# Patient Record
Sex: Male | Born: 1980 | Race: Black or African American | Hispanic: No | Marital: Married | Smoking: Current every day smoker
Health system: Southern US, Community
[De-identification: ages and names within clinical notes are randomized; demographics above are authoritative.]

## PROBLEM LIST (undated history)

## (undated) DIAGNOSIS — I10 Essential (primary) hypertension: Secondary | ICD-10-CM

## (undated) DIAGNOSIS — Z8701 Personal history of pneumonia (recurrent): Secondary | ICD-10-CM

## (undated) DIAGNOSIS — G4733 Obstructive sleep apnea (adult) (pediatric): Secondary | ICD-10-CM

## (undated) DIAGNOSIS — N183 Chronic kidney disease, stage 3 unspecified: Secondary | ICD-10-CM

## (undated) DIAGNOSIS — B370 Candidal stomatitis: Secondary | ICD-10-CM

## (undated) DIAGNOSIS — E559 Vitamin D deficiency, unspecified: Secondary | ICD-10-CM

## (undated) DIAGNOSIS — R7989 Other specified abnormal findings of blood chemistry: Secondary | ICD-10-CM

## (undated) DIAGNOSIS — R569 Unspecified convulsions: Secondary | ICD-10-CM

## (undated) DIAGNOSIS — N19 Unspecified kidney failure: Secondary | ICD-10-CM

## (undated) DIAGNOSIS — E785 Hyperlipidemia, unspecified: Secondary | ICD-10-CM

## (undated) DIAGNOSIS — G40209 Localization-related (focal) (partial) symptomatic epilepsy and epileptic syndromes with complex partial seizures, not intractable, without status epilepticus: Secondary | ICD-10-CM

## (undated) HISTORY — DX: Personal history of pneumonia (recurrent): Z87.01

## (undated) HISTORY — DX: Localization-related (focal) (partial) symptomatic epilepsy and epileptic syndromes with complex partial seizures, not intractable, without status epilepticus: G40.209

## (undated) HISTORY — DX: Chronic kidney disease, stage 3 unspecified: N18.30

## (undated) HISTORY — DX: Hyperlipidemia, unspecified: E78.5

## (undated) HISTORY — DX: Vitamin D deficiency, unspecified: E55.9

## (undated) HISTORY — DX: Other specified abnormal findings of blood chemistry: R79.89

## (undated) HISTORY — DX: Essential (primary) hypertension: I10

## (undated) HISTORY — DX: Obstructive sleep apnea (adult) (pediatric): G47.33

## (undated) HISTORY — PX: WRIST SURGERY: SHX841

## (undated) HISTORY — PX: KIDNEY TRANSPLANT: SHX239

---

## 2011-08-24 ENCOUNTER — Encounter: Payer: Self-pay | Admitting: Emergency Medicine

## 2011-08-24 ENCOUNTER — Emergency Department (HOSPITAL_BASED_OUTPATIENT_CLINIC_OR_DEPARTMENT_OTHER)
Admission: EM | Admit: 2011-08-24 | Discharge: 2011-08-24 | Disposition: A | Discharge status: Home / Self Care | Payer: Medicare Other | Attending: Emergency Medicine | Admitting: Emergency Medicine

## 2011-08-24 DIAGNOSIS — K047 Periapical abscess without sinus: Secondary | ICD-10-CM

## 2011-08-24 DIAGNOSIS — I10 Essential (primary) hypertension: Secondary | ICD-10-CM | POA: Insufficient documentation

## 2011-08-24 DIAGNOSIS — K089 Disorder of teeth and supporting structures, unspecified: Secondary | ICD-10-CM | POA: Insufficient documentation

## 2011-08-24 DIAGNOSIS — Z79899 Other long term (current) drug therapy: Secondary | ICD-10-CM | POA: Insufficient documentation

## 2011-08-24 HISTORY — DX: Essential (primary) hypertension: I10

## 2011-08-24 HISTORY — DX: Unspecified kidney failure: N19

## 2011-08-24 MED ORDER — HYDROCODONE-ACETAMINOPHEN 5-500 MG PO TABS: 1.0000 | ORAL_TABLET | Freq: Four times a day (QID) | ORAL | Status: AC | PRN

## 2011-08-24 MED ORDER — CLINDAMYCIN HCL 150 MG PO CAPS: 150.0000 mg | ORAL_CAPSULE | Freq: Four times a day (QID) | ORAL | Status: AC

## 2011-08-24 NOTE — ED Notes (Signed)
Pt has abscess at right rear tooth.  Pt states he has some tenderness at gum line.  Area swollen and red.

## 2011-08-24 NOTE — ED Provider Notes (Signed)
History     CSN: [id number]  Arrival date & time 08/24/11  1248   First MD Initiated Contact with Patient 08/24/11 1310      Chief Complaint  Patient presents with  . Dental Pain    (Consider location/radiation/quality/duration/timing/severity/associated sxs/prior treatment) Patient is a 31 y.o. male presenting with tooth pain. The history is provided by the patient. No language interpreter was used.  Dental PainThe primary symptoms include mouth pain. Primary symptoms do not include sore throat or angioedema. The symptoms began 5 to 7 days ago. The symptoms are worsening. The symptoms are new. The symptoms occur constantly.  Additional symptoms include: gum swelling and gum tenderness.    Past Medical History  Diagnosis Date  . Hypertension   . Kidney failure     Past Surgical History  Procedure Date  . Kidney transplant     History reviewed. No pertinent family history.  History  Substance Use Topics  . Smoking status: Not on file  . Smokeless tobacco: Not on file  . Alcohol Use:       Review of Systems  Constitutional: Negative.   HENT: Positive for dental problem. Negative for sore throat.   Respiratory: Negative.   Cardiovascular: Negative.     Allergies  Review of patient's allergies indicates no known allergies.  Home Medications   Current Outpatient Rx  Name Route Sig Dispense Refill  . AMLODIPINE BESYLATE 10 MG PO TABS Oral Take 10 mg by mouth daily.      Marland Kitchen CLONIDINE HCL 0.2 MG PO TABS Oral Take 0.2 mg by mouth 3 (three) times daily.      Marland Kitchen FERROUS SULFATE 325 (65 FE) MG PO TABS Oral Take 325 mg by mouth 2 (two) times daily.      Marland Kitchen LABETALOL HCL 100 MG PO TABS Oral Take 100 mg by mouth 4 (four) times daily - after meals and at bedtime.      Marland Kitchen LISINOPRIL 10 MG PO TABS Oral Take 10 mg by mouth daily.      Marland Kitchen MAGNESIUM 30 MG PO TABS Oral Take 30 mg by mouth 2 (two) times daily.      Marland Kitchen MYCOPHENOLATE MOFETIL 250 MG PO CAPS Oral Take 1,000 mg by mouth 2  (two) times daily.      Marland Kitchen PREDNISONE 1 MG PO TABS Oral Take 1 mg by mouth daily.      Marland Kitchen TACROLIMUS 0.5 MG PO CAPS Oral Take 0.5 mg by mouth 3 (three) times daily.      Marland Kitchen CLINDAMYCIN HCL 150 MG PO CAPS Oral Take 1 capsule (150 mg total) by mouth every 6 (six) hours. 28 capsule 0    BP 120/78  Pulse 92  Temp(Src) 98 F (36.7 C) (Oral)  Resp 18  Ht 5\' 6"  (1.676 m)  Wt 158 lb (71.668 kg)  BMI 25.50 kg/m2  SpO2 100%  Physical Exam  Nursing note and vitals reviewed. Constitutional: He is oriented to person, place, and time. He appears well-developed and well-nourished.  HENT:       Pt has swelling noted to the right lower gum:no fluctuance noted  Cardiovascular: Normal rate and regular rhythm.   Pulmonary/Chest: Effort normal and breath sounds normal.  Neurological: He is alert and oriented to person, place, and time.    ED Course  Procedures (including critical care time)  Labs Reviewed - No data to display No results found.   1. Dental abscess       MDM  Will  treat for abscess:pt is waiting for medicaid to start prior to going in to the dentist:not consistent with ludwig's angina       Teressa Lower, NP 08/24/11 1342  Hilario Quarry, MD 08/29/11 [phone]

## 2011-08-29 NOTE — ED Provider Notes (Signed)
History/physical exam/procedure(s) were performed by non-physician practitioner and as supervising physician I was immediately available for consultation/collaboration. I have reviewed all notes and am in agreement with care and plan.   Hilario Quarry, MD 08/29/11 [phone]

## 2011-10-09 ENCOUNTER — Encounter (HOSPITAL_COMMUNITY): Payer: Self-pay | Admitting: Emergency Medicine

## 2011-10-09 ENCOUNTER — Other Ambulatory Visit: Payer: Self-pay

## 2011-10-09 ENCOUNTER — Emergency Department (HOSPITAL_COMMUNITY): Payer: Medicare Other

## 2011-10-09 ENCOUNTER — Emergency Department (HOSPITAL_COMMUNITY)
Admission: EM | Admit: 2011-10-09 | Discharge: 2011-10-09 | Disposition: A | Discharge status: Home / Self Care | Payer: Medicare Other | Attending: Emergency Medicine | Admitting: Emergency Medicine

## 2011-10-09 DIAGNOSIS — Z940 Kidney transplant status: Secondary | ICD-10-CM | POA: Insufficient documentation

## 2011-10-09 DIAGNOSIS — M25519 Pain in unspecified shoulder: Secondary | ICD-10-CM | POA: Insufficient documentation

## 2011-10-09 DIAGNOSIS — M549 Dorsalgia, unspecified: Secondary | ICD-10-CM | POA: Insufficient documentation

## 2011-10-09 DIAGNOSIS — Z79899 Other long term (current) drug therapy: Secondary | ICD-10-CM | POA: Insufficient documentation

## 2011-10-09 DIAGNOSIS — S4980XA Other specified injuries of shoulder and upper arm, unspecified arm, initial encounter: Secondary | ICD-10-CM | POA: Insufficient documentation

## 2011-10-09 DIAGNOSIS — F17200 Nicotine dependence, unspecified, uncomplicated: Secondary | ICD-10-CM | POA: Insufficient documentation

## 2011-10-09 DIAGNOSIS — I10 Essential (primary) hypertension: Secondary | ICD-10-CM | POA: Insufficient documentation

## 2011-10-09 DIAGNOSIS — S46909A Unspecified injury of unspecified muscle, fascia and tendon at shoulder and upper arm level, unspecified arm, initial encounter: Secondary | ICD-10-CM | POA: Insufficient documentation

## 2011-10-09 DIAGNOSIS — R51 Headache: Secondary | ICD-10-CM | POA: Insufficient documentation

## 2011-10-09 DIAGNOSIS — R569 Unspecified convulsions: Secondary | ICD-10-CM | POA: Insufficient documentation

## 2011-10-09 DIAGNOSIS — S4991XA Unspecified injury of right shoulder and upper arm, initial encounter: Secondary | ICD-10-CM

## 2011-10-09 DIAGNOSIS — Y92009 Unspecified place in unspecified non-institutional (private) residence as the place of occurrence of the external cause: Secondary | ICD-10-CM | POA: Insufficient documentation

## 2011-10-09 DIAGNOSIS — R296 Repeated falls: Secondary | ICD-10-CM | POA: Insufficient documentation

## 2011-10-09 DIAGNOSIS — M4040 Postural lordosis, site unspecified: Secondary | ICD-10-CM | POA: Insufficient documentation

## 2011-10-09 DIAGNOSIS — M545 Low back pain, unspecified: Secondary | ICD-10-CM | POA: Insufficient documentation

## 2011-10-09 LAB — DIFFERENTIAL
Basophils Absolute: 0 10*3/uL (ref 0.0–0.1)
Eosinophils Relative: 1 % (ref 0–5)
Lymphs Abs: 0.6 10*3/uL — ABNORMAL LOW (ref 0.7–4.0)
Monocytes Absolute: 0.4 10*3/uL (ref 0.1–1.0)
Monocytes Relative: 4 % (ref 3–12)
Neutrophils Relative %: 89 % — ABNORMAL HIGH (ref 43–77)

## 2011-10-09 LAB — CBC
MCH: 23.3 pg — ABNORMAL LOW (ref 26.0–34.0)
Platelets: 149 10*3/uL — ABNORMAL LOW (ref 150–400)
RBC: 4.59 MIL/uL (ref 4.22–5.81)
RDW: 15.9 % — ABNORMAL HIGH (ref 11.5–15.5)
WBC: 10.1 10*3/uL (ref 4.0–10.5)

## 2011-10-09 LAB — BASIC METABOLIC PANEL
Calcium: 10.1 mg/dL (ref 8.4–10.5)
Creatinine, Ser: 1.76 mg/dL — ABNORMAL HIGH (ref 0.50–1.35)
GFR calc non Af Amer: 50 mL/min — ABNORMAL LOW (ref >90)
Sodium: 137 mEq/L (ref 135–145)

## 2011-10-09 LAB — RAPID URINE DRUG SCREEN, HOSP PERFORMED
Amphetamines: POSITIVE — AB
Opiates: NOT DETECTED

## 2011-10-09 LAB — ETHANOL: Alcohol, Ethyl (B): <11 mg/dL (ref 0–11)

## 2011-10-09 MED ORDER — OXYCODONE-ACETAMINOPHEN 5-325 MG PO TABS: 1.0000 | ORAL_TABLET | ORAL | Status: AC | PRN

## 2011-10-09 MED ORDER — ONDANSETRON HCL 4 MG/2ML IJ SOLN
4.0000 mg | Freq: Once | INTRAMUSCULAR | Status: AC
  Administered 2011-10-09: 4 mg via INTRAVENOUS
  Filled 2011-10-09: qty 2

## 2011-10-09 MED ORDER — OXYCODONE-ACETAMINOPHEN 5-325 MG PO TABS
1.0000 | ORAL_TABLET | Freq: Once | ORAL | Status: AC
  Administered 2011-10-09: 1 via ORAL
  Filled 2011-10-09: qty 1

## 2011-10-09 MED ORDER — HYDROMORPHONE HCL PF 1 MG/ML IJ SOLN
1.0000 mg | Freq: Once | INTRAMUSCULAR | Status: AC
  Administered 2011-10-09: 1 mg via INTRAVENOUS
  Filled 2011-10-09: qty 1

## 2011-10-09 MED ORDER — ONDANSETRON HCL 4 MG/2ML IJ SOLN
INTRAMUSCULAR | Status: AC
  Filled 2011-10-09: qty 2

## 2011-10-09 NOTE — ED Notes (Signed)
Pt found in shower floor by wife after hearing him fall, pt was seizing when wife found him, unsure if seizing prior to fall. No hx of seizure.

## 2011-10-09 NOTE — Discharge Instructions (Signed)
Do not drive until seen by a neurologist for a followup evaluation. Take all of your medications as directed. Where the right arm sling as needed for comfort. Followup with the on-call orthopedic doctor if the shoulder is not better in one week. Return here if needed for problems.  Seizure, Adult A seizure is when the body shakes uncontrollably (convulsion). It can be a scary experience. A seizure is not a diagnosis. It is a sign that something else may be wrong with brain and/or spinal cord (central nervous system). In the Emergency Department, your condition is evaluated. The seizure is then treated. You will likely need follow-up with your caregiver. You will possibly need further testing and evaluation. Your caregiver or the specialist to whom you are referred will determine if further treatment is needed. After a seizure, you may be confused, dazed and drowsy. These problems (symptoms) often follow a seizure. Medication given to treat the seizure may also cause some of these changes. The time following a seizure is known as a refractory period. Hospital admission is seldom required unless there are other conditions present such as trauma or metabolic problems. Sometimes the seizure activity follows a fainting episode. This may have been caused by a brief drop in blood pressure. These fainting (syncopal) seizures are generally not a cause for concern.  HOME CARE INSTRUCTIONS   Follow up with your caregiver as suggested.   If any problems happen, get help right away.   Do not swim or drive until your caregiver says it is okay.  Document Released: 08/01/2000 Document Revised: 04/16/2011 Document Reviewed: 12/15/2007 [facility] Patient Information 2012 [place], [place].

## 2011-10-09 NOTE — ED Provider Notes (Signed)
History     CSN: [id number]  Arrival date & time 10/09/11  1650   First MD Initiated Contact with Patient 10/09/11 1652      Chief Complaint  Patient presents with  . Fall    pt was in the shower, wife found him on the shower floor he was having seizure, unsure if seizure was prior to fall or afterwards. hx kidney transplant.    (Consider location/radiation/quality/duration/timing/severity/associated sxs/prior treatment) HPI Marcus Greene is a 31 y.o. male who was taking a shower when his wife heard him fall. She arrived to find him in an awkward position on the floor of the shower having a seizure. He shook for a few minutes, then stop shaking and was snoring. She called EMS. He remained stuporous until the transport was complete. This does not remember the incident. In emergency department. When I saw him he complained of tongue pain, where he bit it and diffuse back pain. He denies neck pain. He does have a headache.  Patient has no history of seizure. He does not drink alcohol, or take drugs. Patient is followed at [facility] for his kidney transplant. He states he has been taking his medications regularly.     Past Medical History  Diagnosis Date  . Hypertension   . Kidney failure     Past Surgical History  Procedure Date  . Kidney transplant     History reviewed. No pertinent family history.  History  Substance Use Topics  . Smoking status: Current Everyday Smoker -- 1.0 packs/day    Types: Cigarettes  . Smokeless tobacco: Not on file  . Alcohol Use: No      Review of Systems  All other systems reviewed and are negative.    Allergies  Review of patient's allergies indicates no known allergies.  Home Medications   Current Outpatient Rx  Name Route Sig Dispense Refill  . AMLODIPINE BESYLATE 10 MG PO TABS Oral Take 10 mg by mouth daily.      Marland Kitchen CLONIDINE HCL 0.2 MG PO TABS Oral Take 0.2 mg by mouth 3 (three) times daily.      Marland Kitchen FERROUS  SULFATE 325 (65 FE) MG PO TABS Oral Take 325 mg by mouth 2 (two) times daily.      Marland Kitchen HYDROCODONE-ACETAMINOPHEN 5-500 MG PO TABS Oral Take 1-2 tablets by mouth every 6 (six) hours as needed. For pain    . LABETALOL HCL 200 MG PO TABS Oral Take 600 mg by mouth 2 (two) times daily.    Marland Kitchen LISINOPRIL 10 MG PO TABS Oral Take 10 mg by mouth daily.      Marland Kitchen MAGNESIUM OXIDE 400 MG PO TABS Oral Take 400 mg by mouth daily.    Marland Kitchen MYCOPHENOLATE MOFETIL 250 MG PO CAPS Oral Take 1,000 mg by mouth 2 (two) times daily.      Marland Kitchen PREDNISOLONE 5 MG PO TABS Oral Take 5 mg by mouth daily.    Marland Kitchen TACROLIMUS 1 MG PO CAPS Oral Take 4 mg by mouth 2 (two) times daily.    . OXYCODONE-ACETAMINOPHEN 5-325 MG PO TABS Oral Take 1 tablet by mouth every 4 (four) hours as needed for pain. 20 tablet 0    BP 134/90  Pulse 85  Temp(Src) 98 F (36.7 C) (Oral)  Resp 18  SpO2 100%  Physical Exam  Nursing note and vitals reviewed. Constitutional: He is oriented to person, place, and time. He appears well-developed and well-nourished.  HENT:  Head: Normocephalic and  atraumatic.  Right Ear: External ear normal.  Left Ear: External ear normal.  Eyes: Conjunctivae and EOM are normal. Pupils are equal, round, and reactive to light.  Neck: Normal range of motion and phonation normal. Neck supple.  Cardiovascular: Normal rate, regular rhythm, normal heart sounds and intact distal pulses.   Pulmonary/Chest: Effort normal and breath sounds normal. He exhibits no bony tenderness.  Abdominal: Soft. Normal appearance. There is no tenderness.  Musculoskeletal: Normal range of motion.  Neurological: He is alert and oriented to person, place, and time. He has normal strength. No cranial nerve deficit or sensory deficit. He exhibits normal muscle tone. Coordination normal.  Skin: Skin is warm, dry and intact.  Psychiatric: He has a normal mood and affect. His behavior is normal. Judgment and thought content normal.    ED Course  Procedures  (including critical care time)   Date: 10/09/2011  Rate: 89  Rhythm: normal sinus rhythm  QRS Axis: normal  Intervals: normal  ST/T Wave abnormalities: nonspecific ST changes  Conduction Disutrbances:none  Narrative Interpretation:   Old EKG Reviewed: none available  Reevaluation: 21:00 Patient was treated with Dilaudid for back pain and required a second dose. No further seizures in emergency department. Repeat vital signs, normal. Ambulation trial ordered. At this time the patient complains of severe right shoulder pain, a x-ray is ordered. There is no appreciable deformity of the right or left shoulder. Left shoulder moves normally and is nontender to palpation.  23:15: He was treated with a right arm sling and Percocet. Repeat vital signs are reassuring.   Labs Reviewed  BASIC METABOLIC PANEL - Abnormal; Notable for the following:    Glucose, Bld 101 (*)    Creatinine, Ser 1.76 (*)    GFR calc non Af Amer 50 (*)    GFR calc Af Amer 58 (*)    All other components within normal limits  CBC - Abnormal; Notable for the following:    Hemoglobin 10.7 (*)    HCT 31.1 (*)    MCV 67.8 (*)    MCH 23.3 (*)    RDW 15.9 (*)    Platelets 149 (*)    All other components within normal limits  DIFFERENTIAL - Abnormal; Notable for the following:    Neutrophils Relative 89 (*)    Lymphocytes Relative 6 (*)    Neutro Abs 9.0 (*)    Lymphs Abs 0.6 (*)    All other components within normal limits  URINE RAPID DRUG SCREEN (HOSP PERFORMED) - Abnormal; Notable for the following:    Amphetamines POSITIVE (*)    Tetrahydrocannabinol POSITIVE (*)    All other components within normal limits  ETHANOL   Dg Thoracic Spine 2 View  10/09/2011  *RADIOLOGY REPORT*  Clinical Data: Fall, seizure.  THORACIC SPINE - 2 VIEW  Comparison: None.  Findings: No acute bony abnormality.  Specifically, no fracture or malalignment.  No significant degenerative disease.  IMPRESSION: No acute findings.  Original  Report Authenticated By: Cyndie Chime, M.D.   Dg Lumbar Spine Complete  10/09/2011  *RADIOLOGY REPORT*  Clinical Data: Fall, low back pain.  LUMBAR SPINE - COMPLETE 4+ VIEW  Comparison: None  Findings: There are five lumbar-type vertebral bodies.  No fracture or malalignment.  Disc spaces well maintained.  SI joints are symmetric.  IMPRESSION: No bony abnormality.  Original Report Authenticated By: Cyndie Chime, M.D.   Dg Shoulder Right  10/09/2011  *RADIOLOGY REPORT*  Clinical Data: Status post seizure and fall;  right shoulder pain.  RIGHT SHOULDER - 2+ VIEW  Comparison: None.  Findings: There is no evidence of fracture or dislocation.  The right humeral head is seated within the glenoid fossa.  The acromioclavicular joint is unremarkable in appearance.  No significant soft tissue abnormalities are seen.  The visualized portions of the right lung are clear.  IMPRESSION: No evidence of fracture or dislocation.  Original Report Authenticated By: Tonia Ghent, M.D.   Ct Head Wo Contrast  10/09/2011  *RADIOLOGY REPORT*  Clinical Data:  Larey Seat.  Seizure.  CT HEAD WITHOUT CONTRAST CT CERVICAL SPINE WITHOUT CONTRAST  Technique:  Multidetector CT imaging of the head and cervical spine was performed following the standard protocol without intravenous contrast.  Multiplanar CT image reconstructions of the cervical spine were also generated.  Comparison:   None  CT HEAD  Findings: Normal appearing cerebral hemispheres and posterior fossa structures.  Normal size and position of the ventricles.  No skull fracture, intracranial hemorrhage or paranasal sinus air-fluid levels.  IMPRESSION: Normal examination.  CT CERVICAL SPINE  Findings: Reversal of the normal cervical lordosis.  Otherwise, normal appearing bones and soft tissues.  No prevertebral soft tissue swelling, fractures or subluxations.  IMPRESSION:  1.  No fracture or subluxation. 2.  Reversal of the normal cervical lordosis.  Original Report Authenticated  By: Darrol Angel, M.D.   Ct Cervical Spine Wo Contrast  10/09/2011  *RADIOLOGY REPORT*  Clinical Data:  Larey Seat.  Seizure.  CT HEAD WITHOUT CONTRAST CT CERVICAL SPINE WITHOUT CONTRAST  Technique:  Multidetector CT imaging of the head and cervical spine was performed following the standard protocol without intravenous contrast.  Multiplanar CT image reconstructions of the cervical spine were also generated.  Comparison:   None  CT HEAD  Findings: Normal appearing cerebral hemispheres and posterior fossa structures.  Normal size and position of the ventricles.  No skull fracture, intracranial hemorrhage or paranasal sinus air-fluid levels.  IMPRESSION: Normal examination.  CT CERVICAL SPINE  Findings: Reversal of the normal cervical lordosis.  Otherwise, normal appearing bones and soft tissues.  No prevertebral soft tissue swelling, fractures or subluxations.  IMPRESSION:  1.  No fracture or subluxation. 2.  Reversal of the normal cervical lordosis.  Original Report Authenticated By: Darrol Angel, M.D.     1. Seizure   2. Right shoulder injury       MDM  First-time seizure, cause unknown, without significant injury. Patient is on multiple medications, but no common medications that cause seizures. Since this is a first-time seizure and his evaluation is negative. He is stable for discharge with outpatient followup for EEG and expectant management.        Flint Melter, MD 10/09/11 [phone]
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
## 2011-10-09 NOTE — ED Notes (Signed)
Patient back from  X-ray 

## 2011-10-09 NOTE — ED Notes (Signed)
Pt in ct, waiting for lab results for first glucose.

## 2011-10-09 NOTE — Progress Notes (Signed)
Orthopedic Tech Progress Note Patient Details:  Marcus Greene 07-26-1981 [id number]  Other Ortho Devices Type of Ortho Device: Other (comment) (arm sling) Ortho Device Location: (R) UE Ortho Device Interventions: Application   Jennye Moccasin 10/09/2011, 10:57 PM

## 2011-10-09 NOTE — ED Notes (Signed)
Patient transported from X-ray 

## 2011-10-09 NOTE — ED Notes (Signed)
Pt in ked by ems. Denies c/o, states feels "ok now"

## 2012-06-15 DIAGNOSIS — E559 Vitamin D deficiency, unspecified: Secondary | ICD-10-CM | POA: Insufficient documentation

## 2012-10-09 ENCOUNTER — Emergency Department (HOSPITAL_BASED_OUTPATIENT_CLINIC_OR_DEPARTMENT_OTHER)
Admission: EM | Admit: 2012-10-09 | Discharge: 2012-10-09 | Disposition: A | Discharge status: Home / Self Care | Payer: Medicare Other | Attending: Emergency Medicine | Admitting: Emergency Medicine

## 2012-10-09 ENCOUNTER — Encounter (HOSPITAL_BASED_OUTPATIENT_CLINIC_OR_DEPARTMENT_OTHER): Payer: Self-pay | Admitting: *Deleted

## 2012-10-09 DIAGNOSIS — Z87448 Personal history of other diseases of urinary system: Secondary | ICD-10-CM | POA: Insufficient documentation

## 2012-10-09 DIAGNOSIS — B370 Candidal stomatitis: Secondary | ICD-10-CM | POA: Insufficient documentation

## 2012-10-09 DIAGNOSIS — Z79899 Other long term (current) drug therapy: Secondary | ICD-10-CM | POA: Insufficient documentation

## 2012-10-09 DIAGNOSIS — R569 Unspecified convulsions: Secondary | ICD-10-CM | POA: Insufficient documentation

## 2012-10-09 DIAGNOSIS — F17200 Nicotine dependence, unspecified, uncomplicated: Secondary | ICD-10-CM | POA: Insufficient documentation

## 2012-10-09 DIAGNOSIS — I10 Essential (primary) hypertension: Secondary | ICD-10-CM | POA: Insufficient documentation

## 2012-10-09 HISTORY — DX: Candidal stomatitis: B37.0

## 2012-10-09 HISTORY — DX: Unspecified convulsions: R56.9

## 2012-10-09 LAB — RAPID STREP SCREEN (MED CTR MEBANE ONLY): Streptococcus, Group A Screen (Direct): NEGATIVE

## 2012-10-09 MED ORDER — GI COCKTAIL ~~LOC~~
30.0000 mL | Freq: Once | ORAL | Status: AC
  Administered 2012-10-09: 30 mL via ORAL
  Filled 2012-10-09: qty 30

## 2012-10-09 MED ORDER — DIPHENHYDRAMINE HCL 25 MG PO CAPS
25.0000 mg | ORAL_CAPSULE | Freq: Once | ORAL | Status: AC
  Administered 2012-10-09: 25 mg via ORAL
  Filled 2012-10-09: qty 1

## 2012-10-09 MED ORDER — MAGIC MOUTHWASH: 5.0000 mL | Freq: Two times a day (BID) | ORAL | Status: DC | PRN

## 2012-10-09 MED ORDER — DIPHENHYDRAMINE HCL 25 MG PO TABS: 25.0000 mg | ORAL_TABLET | Freq: Four times a day (QID) | ORAL | Status: DC

## 2012-10-09 MED ORDER — NYSTATIN 100000 UNIT/ML MT SUSP: 500000.0000 [IU] | Freq: Four times a day (QID) | OROMUCOSAL | Status: DC

## 2012-10-09 NOTE — ED Notes (Signed)
Pt states he is on a lot of meds and thinks he may have thrush. Hx same "Feels something in throat"

## 2012-10-09 NOTE — ED Notes (Signed)
ED PAC at bedside. 

## 2012-10-09 NOTE — ED Provider Notes (Signed)
History     CSN: [id number]  Arrival date & time 10/09/12  2055   First MD Initiated Contact with Patient 10/09/12 2126      Chief Complaint  Patient presents with  . Sore Throat    (Consider location/radiation/quality/duration/timing/severity/associated sxs/prior treatment) HPI Comments: Patient is a 32 year old male who presents with a 3 day history of sore throat. Patient reports gradual onset and progressively worsening sharp, severe throat pain. The pain is constant and made worse with swallowing. The pain is localized to the patient's throat and equal on both sides. Nothing alleviates the pain. The patient has not tried anything for symptom relief. Patient denies associated subjective fever, cervical adenopathy, and non productive cough. Patient reports having thrush previously which felt exactly like this. Patient is taking immunosuppressant drugs from previous kidney transplant. Patient denies headache, visual changes, sinus congestion, difficulty breathing, chest pain, SOB, abdominal pain, NVD.     Patient is a 32 y.o. male presenting with pharyngitis.  Sore Throat Associated symptoms include a sore throat.    Past Medical History  Diagnosis Date  . Hypertension   . Kidney failure   . Thrush   . Seizures     Past Surgical History  Procedure Laterality Date  . Kidney transplant      History reviewed. No pertinent family history.  History  Substance Use Topics  . Smoking status: Current Every Day Smoker -- 1.00 packs/day    Types: Cigarettes  . Smokeless tobacco: Not on file  . Alcohol Use: No      Review of Systems  HENT: Positive for sore throat.   All other systems reviewed and are negative.    Allergies  Review of patient's allergies indicates no known allergies.  Home Medications   Current Outpatient Rx  Name  Route  Sig  Dispense  Refill  . carbamazepine (TEGRETOL XR) 200 MG 12 hr tablet   Oral   Take 200 mg by mouth 2 (two) times daily.          Marland Kitchen levETIRAcetam (KEPPRA) 1000 MG tablet   Oral   Take 500 mg by mouth 2 (two) times daily.         Marland Kitchen amLODipine (NORVASC) 10 MG tablet   Oral   Take 10 mg by mouth daily.           . cloNIDine (CATAPRES) 0.2 MG tablet   Oral   Take 0.2 mg by mouth 3 (three) times daily.           . ferrous sulfate 325 (65 FE) MG tablet   Oral   Take 325 mg by mouth 2 (two) times daily.           Marland Kitchen HYDROcodone-acetaminophen (VICODIN) 5-500 MG per tablet   Oral   Take 1-2 tablets by mouth every 6 (six) hours as needed. For pain         . labetalol (NORMODYNE) 200 MG tablet   Oral   Take 600 mg by mouth 2 (two) times daily.         Marland Kitchen lisinopril (PRINIVIL,ZESTRIL) 10 MG tablet   Oral   Take 10 mg by mouth daily.           . magnesium oxide (MAG-OX) 400 MG tablet   Oral   Take 400 mg by mouth daily.         . mycophenolate (CELLCEPT) 250 MG capsule   Oral   Take 1,000 mg by mouth 2 (two)  times daily.           . prednisoLONE 5 MG TABS   Oral   Take 5 mg by mouth daily.         . tacrolimus (PROGRAF) 1 MG capsule   Oral   Take 4 mg by mouth 2 (two) times daily.           BP 137/77  Pulse 64  Temp(Src) 98.5 F (36.9 C) (Oral)  Resp 15  Ht 5\' 8"  (1.727 m)  Wt 166 lb (75.297 kg)  BMI 25.25 kg/m2  SpO2 99%  Physical Exam  Nursing note and vitals reviewed. Constitutional: He is oriented to person, place, and time. He appears well-developed and well-nourished. No distress.  HENT:  Head: Normocephalic and atraumatic.  Mouth/Throat: Oropharynx is clear and moist. No oropharyngeal exudate.  Eyes: Conjunctivae and EOM are normal. Pupils are equal, round, and reactive to light.  Neck: Normal range of motion. Neck supple.  Cardiovascular: Normal rate and regular rhythm.  Exam reveals no gallop and no friction rub.   No murmur heard. Pulmonary/Chest: Effort normal and breath sounds normal. He has no wheezes. He has no rales. He exhibits no tenderness.   Abdominal: Soft. He exhibits no distension. There is no tenderness. There is no rebound and no guarding.  Musculoskeletal: Normal range of motion.  Neurological: He is alert and oriented to person, place, and time. Coordination normal.  Speech is goal-oriented. Moves limbs without ataxia.   Skin: Skin is warm and dry.  Psychiatric: He has a normal mood and affect. His behavior is normal.    ED Course  Procedures (including critical care time)  Labs Reviewed  RAPID STREP SCREEN   No results found.   1. Thrush       MDM  10:19 PM Strep test negative. Patient will have gi cocktail here and benadryl for possible congestion and indigestion. I will discharge the patient with nystatin and magic mouthwash for possible thrush. Patient takes immunosuppressing drugs for his kidney transplant. Vitals stable. Patient will be discharged.        Emilia Beck, PA-C 10/09/12 2316

## 2012-10-09 NOTE — ED Provider Notes (Signed)
Medical screening examination/treatment/procedure(s) were performed by non-physician practitioner and as supervising physician I was immediately available for consultation/collaboration.  Noor Vidales, MD 10/09/12 2352 

## 2013-07-24 ENCOUNTER — Emergency Department (HOSPITAL_BASED_OUTPATIENT_CLINIC_OR_DEPARTMENT_OTHER): Payer: Medicare Other

## 2013-07-24 ENCOUNTER — Emergency Department (HOSPITAL_BASED_OUTPATIENT_CLINIC_OR_DEPARTMENT_OTHER)
Admission: EM | Admit: 2013-07-24 | Discharge: 2013-07-25 | Disposition: A | Discharge status: Home / Self Care | Payer: Medicare Other | Attending: Emergency Medicine | Admitting: Emergency Medicine

## 2013-07-24 ENCOUNTER — Encounter (HOSPITAL_BASED_OUTPATIENT_CLINIC_OR_DEPARTMENT_OTHER): Payer: Self-pay | Admitting: Emergency Medicine

## 2013-07-24 DIAGNOSIS — M549 Dorsalgia, unspecified: Secondary | ICD-10-CM | POA: Insufficient documentation

## 2013-07-24 DIAGNOSIS — G40909 Epilepsy, unspecified, not intractable, without status epilepticus: Secondary | ICD-10-CM | POA: Insufficient documentation

## 2013-07-24 DIAGNOSIS — F17200 Nicotine dependence, unspecified, uncomplicated: Secondary | ICD-10-CM | POA: Insufficient documentation

## 2013-07-24 DIAGNOSIS — Z8619 Personal history of other infectious and parasitic diseases: Secondary | ICD-10-CM | POA: Insufficient documentation

## 2013-07-24 DIAGNOSIS — R6889 Other general symptoms and signs: Secondary | ICD-10-CM

## 2013-07-24 DIAGNOSIS — R509 Fever, unspecified: Secondary | ICD-10-CM | POA: Insufficient documentation

## 2013-07-24 DIAGNOSIS — Z940 Kidney transplant status: Secondary | ICD-10-CM | POA: Insufficient documentation

## 2013-07-24 DIAGNOSIS — N189 Chronic kidney disease, unspecified: Secondary | ICD-10-CM | POA: Insufficient documentation

## 2013-07-24 DIAGNOSIS — R5381 Other malaise: Secondary | ICD-10-CM | POA: Insufficient documentation

## 2013-07-24 DIAGNOSIS — J3489 Other specified disorders of nose and nasal sinuses: Secondary | ICD-10-CM | POA: Insufficient documentation

## 2013-07-24 DIAGNOSIS — R05 Cough: Secondary | ICD-10-CM | POA: Insufficient documentation

## 2013-07-24 DIAGNOSIS — IMO0002 Reserved for concepts with insufficient information to code with codable children: Secondary | ICD-10-CM | POA: Insufficient documentation

## 2013-07-24 DIAGNOSIS — R109 Unspecified abdominal pain: Secondary | ICD-10-CM | POA: Insufficient documentation

## 2013-07-24 DIAGNOSIS — I129 Hypertensive chronic kidney disease with stage 1 through stage 4 chronic kidney disease, or unspecified chronic kidney disease: Secondary | ICD-10-CM | POA: Insufficient documentation

## 2013-07-24 DIAGNOSIS — Z79899 Other long term (current) drug therapy: Secondary | ICD-10-CM | POA: Insufficient documentation

## 2013-07-24 DIAGNOSIS — IMO0001 Reserved for inherently not codable concepts without codable children: Secondary | ICD-10-CM | POA: Insufficient documentation

## 2013-07-24 DIAGNOSIS — R059 Cough, unspecified: Secondary | ICD-10-CM | POA: Insufficient documentation

## 2013-07-24 DIAGNOSIS — R197 Diarrhea, unspecified: Secondary | ICD-10-CM | POA: Insufficient documentation

## 2013-07-24 MED ORDER — SODIUM CHLORIDE 0.9 % IV BOLUS (SEPSIS)
1000.0000 mL | Freq: Once | INTRAVENOUS | Status: AC
  Administered 2013-07-24: 1000 mL via INTRAVENOUS

## 2013-07-24 MED ORDER — ACETAMINOPHEN 325 MG PO TABS
ORAL_TABLET | ORAL | Status: AC
  Administered 2013-07-24: 650 mg via ORAL
  Filled 2013-07-24: qty 2

## 2013-07-24 MED ORDER — ACETAMINOPHEN 325 MG PO TABS
650.0000 mg | ORAL_TABLET | Freq: Once | ORAL | Status: AC
  Administered 2013-07-24: 650 mg via ORAL

## 2013-07-24 NOTE — ED Notes (Signed)
Pt reports chills, body aches and fever x 1 day

## 2013-07-24 NOTE — ED Notes (Signed)
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 No thrill appreciated to palpation. Reports it has not been used since he had kidney transplant in 2010. Restricted extremity band applied

## 2013-07-24 NOTE — ED Provider Notes (Signed)
CSN: [id number]     Arrival date & time 07/24/13  2150 History   First MD Initiated Contact with Patient 07/24/13 2302     Chief Complaint  Patient presents with  . Fever   (Consider location/radiation/quality/duration/timing/severity/associated sxs/prior Treatment) HPI Patient presents with 24 hours of subjective fevers and chills at home. States he's had diffuse myalgias. He complains of nasal congestion and sore throat. He's had a mild nonproductive cough. He's had 7-8 episodes of nonbloody diarrhea. He's had no nausea or vomiting. He denies any abdominal pain. He does complain of right flank pain. He states there've been people at work with is "stomach flu". He denies any neck pain or stiffness. He has not had a flu shot this year. Patient does have a history of a kidney transplant Past Medical History  Diagnosis Date  . Hypertension   . Kidney failure   . Thrush   . Seizures     last seizure 01/2013   Past Surgical History  Procedure Laterality Date  . Kidney transplant     No family history on file. History  Substance Use Topics  . Smoking status: Current Every Day Smoker -- 1.00 packs/day    Types: Cigarettes  . Smokeless tobacco: Never Used  . Alcohol Use: No    Review of Systems  Constitutional: Positive for fever, chills and fatigue.  HENT: Positive for congestion and sore throat. Negative for sinus pressure.   Respiratory: Positive for cough. Negative for shortness of breath.   Cardiovascular: Negative for chest pain, palpitations and leg swelling.  Gastrointestinal: Positive for diarrhea. Negative for nausea, vomiting, abdominal pain and blood in stool.  Genitourinary: Positive for flank pain. Negative for dysuria, frequency and hematuria.  Musculoskeletal: Positive for back pain and myalgias. Negative for neck pain and neck stiffness.  Skin: Negative for pallor, rash and wound.  Neurological: Negative for dizziness, weakness, light-headedness, numbness and  headaches.  All other systems reviewed and are negative.    Allergies  Review of patient's allergies indicates no known allergies.  Home Medications   Current Outpatient Rx  Name  Route  Sig  Dispense  Refill  . amLODipine (NORVASC) 10 MG tablet   Oral   Take 10 mg by mouth daily.           . carbamazepine (TEGRETOL XR) 200 MG 12 hr tablet   Oral   Take 200 mg by mouth 2 (two) times daily.         . cholecalciferol (VITAMIN D) 1000 UNITS tablet   Oral   Take 1,000 Units by mouth 2 (two) times daily.         . cloNIDine (CATAPRES) 0.2 MG tablet   Oral   Take 0.2 mg by mouth 3 (three) times daily.           . ferrous sulfate 325 (65 FE) MG tablet   Oral   Take 325 mg by mouth 2 (two) times daily.           Marland Kitchen labetalol (NORMODYNE) 200 MG tablet   Oral   Take 600 mg by mouth 2 (two) times daily.         Marland Kitchen levETIRAcetam (KEPPRA) 1000 MG tablet   Oral   Take 500 mg by mouth 2 (two) times daily.         Marland Kitchen lisinopril (PRINIVIL,ZESTRIL) 10 MG tablet   Oral   Take 10 mg by mouth daily.           Marland Kitchen  magnesium oxide (MAG-OX) 400 MG tablet   Oral   Take 400 mg by mouth daily.         . mycophenolate (CELLCEPT) 250 MG capsule   Oral   Take 1,000 mg by mouth 2 (two) times daily.           . prednisoLONE 5 MG TABS   Oral   Take 5 mg by mouth daily.         . tacrolimus (PROGRAF) 1 MG capsule   Oral   Take 4 mg by mouth 2 (two) times daily.         . Alum & Mag Hydroxide-Simeth (MAGIC MOUTHWASH) SOLN   Oral   Take 5 mLs by mouth 2 (two) times daily as needed.   60 mL   0   . diphenhydrAMINE (BENADRYL) 25 MG tablet   Oral   Take 1 tablet (25 mg total) by mouth every 6 (six) hours.   20 tablet   0   . HYDROcodone-acetaminophen (VICODIN) 5-500 MG per tablet   Oral   Take 1-2 tablets by mouth every 6 (six) hours as needed. For pain         . nystatin (MYCOSTATIN) 100000 UNIT/ML suspension   Oral   Take 5 mLs (500,000 Units total) by  mouth 4 (four) times daily.   60 mL   0    BP 127/86  Pulse 104  Temp(Src) 101.9 F (38.8 C) (Oral)  Resp 20  Ht 5\' 9"  (1.753 m)  Wt 159 lb (72.122 kg)  BMI 23.47 kg/m2  SpO2 100% Physical Exam  Nursing note and vitals reviewed. Constitutional: He is oriented to person, place, and time. He appears well-developed and well-nourished. No distress.  HENT:  Head: Normocephalic and atraumatic.  Mouth/Throat: Oropharynx is clear and moist. No oropharyngeal exudate.  Sinus tenderness to percussion. Patient has bilateral TM fullness and erythema.  Eyes: EOM are normal. Pupils are equal, round, and reactive to light.  Neck: Normal range of motion. Neck supple.  No meningismus  Cardiovascular: Normal rate and regular rhythm.  Exam reveals no gallop and no friction rub.   No murmur heard. Pulmonary/Chest: Effort normal and breath sounds normal. No respiratory distress. He has no wheezes. He has no rales. He exhibits no tenderness.  Abdominal: Soft. Bowel sounds are normal. He exhibits no distension and no mass. There is no tenderness. There is no rebound and no guarding.  Musculoskeletal: Normal range of motion. He exhibits no edema and no tenderness.  Mild right flank tenderness to percussion.  Neurological: He is alert and oriented to person, place, and time.  Patient is alert and oriented x3 with clear, goal oriented speech. Patient has 5/5 motor in all extremities. Sensation is intact to light touch. Patient has a normal gait and walks without assistance.   Skin: Skin is warm and dry. No rash noted. No erythema.  Psychiatric: He has a normal mood and affect. His behavior is normal.    ED Course  Procedures (including critical care time) Labs Review Labs Reviewed - No data to display Imaging Review No results found.  EKG Interpretation   None       MDM   Patient states he is feeling better after the IV fluids. Patient's labs show no evidence of infection in the urine. It  does appear that he may be mildly dehydrated. I suspect the patient has a viral illness and possibly the flu. Will start on Tamiflu. Patient has been encouraged  to followup with his primary MD in the next one to 2 days to be reevaluated. I given very thorough discharge instructions with the patient and his family he is return for worsening symptoms, persistent vomiting worsening flank pain or for any concerns.  Loren Racer, MD 07/25/13 [phone]

## 2013-07-25 LAB — COMPREHENSIVE METABOLIC PANEL
BUN: 20 mg/dL (ref 6–23)
Calcium: 9.1 mg/dL (ref 8.4–10.5)
GFR calc Af Amer: 52 mL/min — ABNORMAL LOW (ref >90)
Glucose, Bld: 95 mg/dL (ref 70–99)
Total Protein: 6.9 g/dL (ref 6.0–8.3)

## 2013-07-25 LAB — CBC WITH DIFFERENTIAL/PLATELET
Basophils Absolute: 0 10*3/uL (ref 0.0–0.1)
Lymphs Abs: 0.6 10*3/uL — ABNORMAL LOW (ref 0.7–4.0)
MCH: 25.3 pg — ABNORMAL LOW (ref 26.0–34.0)
MCV: 72 fL — ABNORMAL LOW (ref 78.0–100.0)
Monocytes Absolute: 0.8 10*3/uL (ref 0.1–1.0)
Platelets: 148 10*3/uL — ABNORMAL LOW (ref 150–400)
RDW: 14.7 % (ref 11.5–15.5)
WBC: 6.3 10*3/uL (ref 4.0–10.5)

## 2013-07-25 LAB — URINALYSIS, ROUTINE W REFLEX MICROSCOPIC
Ketones, ur: 15 mg/dL — AB
Leukocytes, UA: NEGATIVE
Nitrite: NEGATIVE
Specific Gravity, Urine: 1.018 (ref 1.005–1.030)
pH: 5.5 (ref 5.0–8.0)

## 2013-07-25 MED ORDER — OSELTAMIVIR PHOSPHATE 75 MG PO CAPS: 75.0000 mg | ORAL_CAPSULE | Freq: Two times a day (BID) | ORAL | Status: DC

## 2013-07-25 MED ORDER — OSELTAMIVIR PHOSPHATE 75 MG PO CAPS
75.0000 mg | ORAL_CAPSULE | Freq: Once | ORAL | Status: AC
  Administered 2013-07-25: 75 mg via ORAL
  Filled 2013-07-25: qty 1

## 2013-07-31 LAB — CULTURE, BLOOD (ROUTINE X 2): Culture: NO GROWTH

## 2014-01-28 ENCOUNTER — Encounter (HOSPITAL_BASED_OUTPATIENT_CLINIC_OR_DEPARTMENT_OTHER): Payer: Self-pay | Admitting: Emergency Medicine

## 2014-01-28 ENCOUNTER — Emergency Department (HOSPITAL_BASED_OUTPATIENT_CLINIC_OR_DEPARTMENT_OTHER): Payer: Medicare Other

## 2014-01-28 ENCOUNTER — Emergency Department (HOSPITAL_BASED_OUTPATIENT_CLINIC_OR_DEPARTMENT_OTHER)
Admission: EM | Admit: 2014-01-28 | Discharge: 2014-01-28 | Disposition: A | Discharge status: Home / Self Care | Payer: Medicare Other | Attending: Emergency Medicine | Admitting: Emergency Medicine

## 2014-01-28 DIAGNOSIS — Z87448 Personal history of other diseases of urinary system: Secondary | ICD-10-CM | POA: Insufficient documentation

## 2014-01-28 DIAGNOSIS — Z8619 Personal history of other infectious and parasitic diseases: Secondary | ICD-10-CM | POA: Insufficient documentation

## 2014-01-28 DIAGNOSIS — R51 Headache: Secondary | ICD-10-CM | POA: Insufficient documentation

## 2014-01-28 DIAGNOSIS — Z79899 Other long term (current) drug therapy: Secondary | ICD-10-CM | POA: Insufficient documentation

## 2014-01-28 DIAGNOSIS — F17200 Nicotine dependence, unspecified, uncomplicated: Secondary | ICD-10-CM | POA: Insufficient documentation

## 2014-01-28 DIAGNOSIS — R519 Headache, unspecified: Secondary | ICD-10-CM

## 2014-01-28 DIAGNOSIS — G40909 Epilepsy, unspecified, not intractable, without status epilepticus: Secondary | ICD-10-CM | POA: Insufficient documentation

## 2014-01-28 DIAGNOSIS — I10 Essential (primary) hypertension: Secondary | ICD-10-CM | POA: Insufficient documentation

## 2014-01-28 DIAGNOSIS — IMO0002 Reserved for concepts with insufficient information to code with codable children: Secondary | ICD-10-CM | POA: Insufficient documentation

## 2014-01-28 MED ORDER — OXYCODONE-ACETAMINOPHEN 5-325 MG PO TABS
ORAL_TABLET | ORAL | Status: AC
  Administered 2014-01-28: 1 via ORAL
  Filled 2014-01-28: qty 1

## 2014-01-28 MED ORDER — FLUORESCEIN SODIUM 1 MG OP STRP
1.0000 | ORAL_STRIP | Freq: Once | OPHTHALMIC | Status: AC
  Administered 2014-01-28: 1 via OPHTHALMIC
  Filled 2014-01-28: qty 1

## 2014-01-28 MED ORDER — OXYCODONE-ACETAMINOPHEN 5-325 MG PO TABS: 1.0000 | ORAL_TABLET | ORAL | Status: DC | PRN

## 2014-01-28 MED ORDER — TETRACAINE HCL 0.5 % OP SOLN
2.0000 [drp] | Freq: Once | OPHTHALMIC | Status: AC
  Administered 2014-01-28: 2 [drp] via OPHTHALMIC
  Filled 2014-01-28: qty 2

## 2014-01-28 MED ORDER — OXYCODONE-ACETAMINOPHEN 5-325 MG PO TABS
1.0000 | ORAL_TABLET | Freq: Once | ORAL | Status: AC
  Administered 2014-01-28: 1 via ORAL

## 2014-01-28 NOTE — ED Notes (Signed)
Family at bedside. 

## 2014-01-28 NOTE — ED Notes (Signed)
Woods lamp and tonometer set up at bedside.

## 2014-01-28 NOTE — Discharge Instructions (Signed)

## 2014-01-28 NOTE — ED Notes (Signed)
Patient transported to CT 

## 2014-01-28 NOTE — ED Notes (Signed)
Pt states he has had left eye pain and swelling off and on since Monday with pain going to left ear. Pt states he has not taken any medication for it.

## 2014-01-28 NOTE — ED Provider Notes (Signed)
CSN: [id number]     Arrival date & time 01/28/14  1316 History   First MD Initiated Contact with Patient 01/28/14 1329     Chief Complaint  Patient presents with  . Otalgia     (Consider location/radiation/quality/duration/timing/severity/associated sxs/prior Treatment) Patient is a 33 y.o. male presenting with headaches. The history is provided by the patient. No language interpreter was used.  Headache Pain location:  L temporal Associated symptoms: ear pain   Associated symptoms: no congestion, no fever, no nausea and no sore throat   Associated symptoms comment:  Pain surrounding left eye that is sharp in nature and causes sharp, shooting pain to temple and ear. He states the pain started 4 days ago and initially only presented after he got home from work. Now there is some degree of pain constantly and the shooting pain persists periodically. No visual changes but reports his eye is tearing excessively. No purulent drainage from eye. He states there seems to have been mild swelling to upper eye lid but not significant. No nausea, vomiting. There is some mild photophobia.   Past Medical History  Diagnosis Date  . Hypertension   . Kidney failure   . Thrush   . Seizures     last seizure 01/2013   Past Surgical History  Procedure Laterality Date  . Kidney transplant     No family history on file. History  Substance Use Topics  . Smoking status: Current Every Day Smoker -- 1.00 packs/day    Types: Cigarettes  . Smokeless tobacco: Never Used  . Alcohol Use: No    Review of Systems  Constitutional: Negative for fever and chills.  HENT: Positive for ear pain and facial swelling. Negative for congestion and sore throat.   Respiratory: Negative.  Negative for shortness of breath.   Cardiovascular: Negative.   Gastrointestinal: Negative.  Negative for nausea.  Musculoskeletal: Negative.   Skin: Negative.   Neurological: Positive for headaches.      Allergies  Review of  patient's allergies indicates no known allergies.  Home Medications   Prior to Admission medications   Medication Sig Start Date End Date Taking? Authorizing Provider  Alum & Mag Hydroxide-Simeth (MAGIC MOUTHWASH) SOLN Take 5 mLs by mouth 2 (two) times daily as needed. 10/09/12   Kaitlyn Szekalski, PA-C  amLODipine (NORVASC) 10 MG tablet Take 10 mg by mouth daily.      Historical Provider, MD  carbamazepine (TEGRETOL XR) 200 MG 12 hr tablet Take 200 mg by mouth 2 (two) times daily.    Historical Provider, MD  cholecalciferol (VITAMIN D) 1000 UNITS tablet Take 1,000 Units by mouth 2 (two) times daily.    Historical Provider, MD  cloNIDine (CATAPRES) 0.2 MG tablet Take 0.2 mg by mouth 3 (three) times daily.      Historical Provider, MD  diphenhydrAMINE (BENADRYL) 25 MG tablet Take 1 tablet (25 mg total) by mouth every 6 (six) hours. 10/09/12   Kaitlyn Szekalski, PA-C  ferrous sulfate 325 (65 FE) MG tablet Take 325 mg by mouth 2 (two) times daily.      Historical Provider, MD  HYDROcodone-acetaminophen (VICODIN) 5-500 MG per tablet Take 1-2 tablets by mouth every 6 (six) hours as needed. For pain    Historical Provider, MD  labetalol (NORMODYNE) 200 MG tablet Take 600 mg by mouth 2 (two) times daily.    Historical Provider, MD  levETIRAcetam (KEPPRA) 1000 MG tablet Take 500 mg by mouth 2 (two) times daily.    Historical Provider,  MD  lisinopril (PRINIVIL,ZESTRIL) 10 MG tablet Take 10 mg by mouth daily.      Historical Provider, MD  magnesium oxide (MAG-OX) 400 MG tablet Take 400 mg by mouth daily.    Historical Provider, MD  mycophenolate (CELLCEPT) 250 MG capsule Take 1,000 mg by mouth 2 (two) times daily.      Historical Provider, MD  nystatin (MYCOSTATIN) 100000 UNIT/ML suspension Take 5 mLs (500,000 Units total) by mouth 4 (four) times daily. 10/09/12   Emilia BeckKaitlyn Szekalski, PA-C  oseltamivir (TAMIFLU) 75 MG capsule Take 1 capsule (75 mg total) by mouth every 12 (twelve) hours. 07/25/13   Loren Raceravid  [place]elverton, MD  prednisoLONE 5 MG TABS Take 5 mg by mouth daily.    Historical Provider, MD  tacrolimus (PROGRAF) 1 MG capsule Take 4 mg by mouth 2 (two) times daily.    Historical Provider, MD   BP 125/88  Pulse 81  Temp(Src) 99 F (37.2 C)  Resp 14  Ht 5\' 7"  (1.702 m)  Wt 153 lb 11.2 oz (69.718 kg)  BMI 24.07 kg/m2  SpO2 100% Physical Exam  Constitutional: He is oriented to person, place, and time. He appears well-developed and well-nourished.  HENT:  Head: Normocephalic and atraumatic.  No reproducible temporal pain. Left TM is clear. No temporal bruit.  Eyes: Conjunctivae and EOM are normal. Pupils are equal, round, and reactive to light.  FROM. Minimal conjunctival redness on left. No conjunctival swelling or chemosis. No swelling of lids. Left intraocular pressure 18 x 2 reads. No fluorescein uptake on left. No hyphema.  Neck: Normal range of motion.  Cardiovascular: Normal rate and regular rhythm.   No murmur heard. Pulmonary/Chest: Effort normal and breath sounds normal. He has no wheezes. He has no rales.  Abdominal: Soft. There is no tenderness.  Neurological: He is alert and oriented to person, place, and time. He has normal strength and normal reflexes. No sensory deficit. He displays a negative Romberg sign.  CN's 3-12 grossly intact. No deficits of strength or coordination. No ataxia. Speech clear and focused.     ED Course  Procedures (including critical care time) Labs Review Labs Reviewed - No data to display  Imaging Review Ct Orbitss W/o Cm  01/28/2014   CLINICAL DATA:  Left orbital pain.  Left headache, earache.  EXAM: CT ORBITS WITHOUT CONTRAST  TECHNIQUE: Multidetector CT imaging of the orbits was performed following the standard protocol without intravenous contrast.  COMPARISON:  CT of the head 10/09/2011  FINDINGS: Globes are unremarkable. Orbital soft tissues symmetric and unremarkable. No evidence of focal fluid collection to suggest orbital abscess or  other abnormality. Bony structures are intact. Mild mucosal thickening in the maxillary sinuses. No air-fluid levels. Mastoid air cells are clear.  IMPRESSION: No orbital abnormality.  Mild chronic maxillary sinusitis.   Electronically Signed   By: Charlett NoseKevin  Dover M.D.   On: 01/28/2014 15:11     EKG Interpretation None      MDM   Final diagnoses:  None    1. Headache  Normal eye pressures - not glaucoma. Dye negative - no evidence of injury or abrasion to eye. Normal neurologic exam without deficits - doubt IC pathology. CT orbits negative - no evidence of orbital infection. DDx: migraine variant vs nonspecific headache. Percocet offering some relief. Patient is evaluated by Dr. Patria Maneampos. Stable for discharge.     Arnoldo HookerShari A Char Feltman, PA-C 01/28/14 1640

## 2014-01-28 NOTE — ED Notes (Signed)
Onset of non traumatic intermittent left sided headache and ear pain one week ago.  Worse after he gets home from work, discomfort keeping him up at night.  C/o left eye tearing up, watering.  Denies fever.

## 2014-01-29 NOTE — ED Provider Notes (Signed)
Medical screening examination/treatment/procedure(s) were performed by non-physician practitioner and as supervising physician I was immediately available for consultation/collaboration.   EKG Interpretation None        Lyanne CoKevin M Trinty Marken, MD 01/29/14 [id number]

## 2014-08-18 DIAGNOSIS — Z940 Kidney transplant status: Secondary | ICD-10-CM

## 2014-08-18 HISTORY — DX: Kidney transplant status: Z94.0

## 2014-12-27 ENCOUNTER — Encounter (HOSPITAL_BASED_OUTPATIENT_CLINIC_OR_DEPARTMENT_OTHER): Payer: Self-pay | Admitting: *Deleted

## 2014-12-27 ENCOUNTER — Emergency Department (HOSPITAL_BASED_OUTPATIENT_CLINIC_OR_DEPARTMENT_OTHER)
Admission: EM | Admit: 2014-12-27 | Discharge: 2014-12-27 | Disposition: A | Discharge status: Home / Self Care | Payer: Medicare Other | Attending: Emergency Medicine | Admitting: Emergency Medicine

## 2014-12-27 DIAGNOSIS — R05 Cough: Secondary | ICD-10-CM | POA: Insufficient documentation

## 2014-12-27 DIAGNOSIS — K088 Other specified disorders of teeth and supporting structures: Secondary | ICD-10-CM | POA: Diagnosis present

## 2014-12-27 DIAGNOSIS — Z87441 Personal history of nephrotic syndrome: Secondary | ICD-10-CM | POA: Diagnosis not present

## 2014-12-27 DIAGNOSIS — Z7952 Long term (current) use of systemic steroids: Secondary | ICD-10-CM | POA: Insufficient documentation

## 2014-12-27 DIAGNOSIS — Z8619 Personal history of other infectious and parasitic diseases: Secondary | ICD-10-CM | POA: Diagnosis not present

## 2014-12-27 DIAGNOSIS — I10 Essential (primary) hypertension: Secondary | ICD-10-CM | POA: Insufficient documentation

## 2014-12-27 DIAGNOSIS — Z720 Tobacco use: Secondary | ICD-10-CM | POA: Insufficient documentation

## 2014-12-27 DIAGNOSIS — K047 Periapical abscess without sinus: Secondary | ICD-10-CM | POA: Diagnosis not present

## 2014-12-27 DIAGNOSIS — Z79899 Other long term (current) drug therapy: Secondary | ICD-10-CM | POA: Insufficient documentation

## 2014-12-27 DIAGNOSIS — G40909 Epilepsy, unspecified, not intractable, without status epilepticus: Secondary | ICD-10-CM | POA: Diagnosis not present

## 2014-12-27 LAB — URINALYSIS, ROUTINE W REFLEX MICROSCOPIC
BILIRUBIN URINE: NEGATIVE
Glucose, UA: NEGATIVE mg/dL
Ketones, ur: NEGATIVE mg/dL
Leukocytes, UA: NEGATIVE
NITRITE: NEGATIVE
PH: 6 (ref 5.0–8.0)
Protein, ur: 100 mg/dL — AB
SPECIFIC GRAVITY, URINE: 1.023 (ref 1.005–1.030)
UROBILINOGEN UA: 0.2 mg/dL (ref 0.0–1.0)

## 2014-12-27 LAB — CBC WITH DIFFERENTIAL/PLATELET
BASOS ABS: 0 10*3/uL (ref 0.0–0.1)
BASOS PCT: 0 % (ref 0–1)
EOS PCT: 1 % (ref 0–5)
Eosinophils Absolute: 0.1 10*3/uL (ref 0.0–0.7)
HEMATOCRIT: 36.3 % — AB (ref 39.0–52.0)
HEMOGLOBIN: 12.9 g/dL — AB (ref 13.0–17.0)
LYMPHS PCT: 12 % (ref 12–46)
Lymphs Abs: 1.5 10*3/uL (ref 0.7–4.0)
MCH: 25.3 pg — AB (ref 26.0–34.0)
MCHC: 35.5 g/dL (ref 30.0–36.0)
MCV: 71.2 fL — ABNORMAL LOW (ref 78.0–100.0)
Monocytes Absolute: 1.1 10*3/uL — ABNORMAL HIGH (ref 0.1–1.0)
Monocytes Relative: 9 % (ref 3–12)
NEUTROS PCT: 78 % — AB (ref 43–77)
Neutro Abs: 9.9 10*3/uL — ABNORMAL HIGH (ref 1.7–7.7)
Platelets: 186 10*3/uL (ref 150–400)
RBC: 5.1 MIL/uL (ref 4.22–5.81)
RDW: 14.7 % (ref 11.5–15.5)
WBC: 12.6 10*3/uL — ABNORMAL HIGH (ref 4.0–10.5)

## 2014-12-27 LAB — BASIC METABOLIC PANEL
Anion gap: 9 (ref 5–15)
BUN: 18 mg/dL (ref 6–20)
CALCIUM: 9.1 mg/dL (ref 8.9–10.3)
CO2: 27 mmol/L (ref 22–32)
Chloride: 99 mmol/L — ABNORMAL LOW (ref 101–111)
Creatinine, Ser: 1.62 mg/dL — ABNORMAL HIGH (ref 0.61–1.24)
GFR calc Af Amer: >60 mL/min (ref >60)
GFR calc non Af Amer: 54 mL/min — ABNORMAL LOW (ref >60)
GLUCOSE: 118 mg/dL — AB (ref 70–99)
POTASSIUM: 3.8 mmol/L (ref 3.5–5.1)
Sodium: 135 mmol/L (ref 135–145)

## 2014-12-27 LAB — URINE MICROSCOPIC-ADD ON

## 2014-12-27 MED ORDER — OXYCODONE-ACETAMINOPHEN 5-325 MG PO TABS: 1.0000 | ORAL_TABLET | Freq: Three times a day (TID) | ORAL | Status: DC | PRN

## 2014-12-27 MED ORDER — CLINDAMYCIN HCL 300 MG PO CAPS: 300.0000 mg | ORAL_CAPSULE | Freq: Four times a day (QID) | ORAL | Status: DC

## 2014-12-27 MED ORDER — OXYCODONE-ACETAMINOPHEN 5-325 MG PO TABS
1.0000 | ORAL_TABLET | Freq: Once | ORAL | Status: AC
  Administered 2014-12-27: 1 via ORAL
  Filled 2014-12-27: qty 1

## 2014-12-27 MED ORDER — CLINDAMYCIN HCL 150 MG PO CAPS
300.0000 mg | ORAL_CAPSULE | Freq: Once | ORAL | Status: AC
  Administered 2014-12-27: 300 mg via ORAL
  Filled 2014-12-27: qty 2

## 2014-12-27 NOTE — ED Notes (Signed)
Pt c/o dental pain and facial swelling x 4 days pt also c/o fever 100.4 at home. Also just finished cipro for UTI.

## 2014-12-27 NOTE — ED Provider Notes (Signed)
CSN: [id number]     Arrival date & time 12/27/14  1626 History   First MD Initiated Contact with Patient 12/27/14 1647     Chief Complaint  Patient presents with  . Dental Pain    Patient is a 34 y.o. male presenting with tooth pain. The history is provided by the patient.  Dental Pain Location:  Lower Severity:  Moderate Onset quality:  Gradual Duration:  1 day Timing:  Constant Progression:  Worsening Chronicity:  New Relieved by:  Nothing Worsened by:  Jaw movement and pressure Associated symptoms: fever   Associated symptoms: no difficulty swallowing and no drooling   Pt has h/o renal transplant He takes anti-rejection meds He reports recently taking cipro from UTI, he has completed this course Over past day he has had facial pain/dental pain/facial swelling He also had fever at home  Family member thinks he may have had SZ just prior to arrival ( has h/o partial seizures) as he is drowsy and mildly confused.      Past Medical History  Diagnosis Date  . Hypertension   . Kidney failure   . Thrush   . Seizures     last seizure 01/2013   Past Surgical History  Procedure Laterality Date  . Kidney transplant     History reviewed. No pertinent family history. History  Substance Use Topics  . Smoking status: Current Every Day Smoker -- 1.00 packs/day    Types: Cigarettes  . Smokeless tobacco: Never Used  . Alcohol Use: No    Review of Systems  Constitutional: Positive for fever.  HENT: Negative for drooling.   Respiratory: Positive for cough.   Gastrointestinal: Negative for vomiting and abdominal pain.  All other systems reviewed and are negative.     Allergies  Review of patient's allergies indicates no known allergies.  Home Medications   Prior to Admission medications   Medication Sig Start Date End Date Taking? Authorizing Provider  amLODipine (NORVASC) 10 MG tablet Take 10 mg by mouth daily.      Historical Provider, MD  carbamazepine (TEGRETOL  XR) 200 MG 12 hr tablet Take 200 mg by mouth 2 (two) times daily.    Historical Provider, MD  cholecalciferol (VITAMIN D) 1000 UNITS tablet Take 1,000 Units by mouth 2 (two) times daily.    Historical Provider, MD  cloNIDine (CATAPRES) 0.2 MG tablet Take 0.2 mg by mouth 3 (three) times daily.      Historical Provider, MD  ferrous sulfate 325 (65 FE) MG tablet Take 325 mg by mouth 2 (two) times daily.      Historical Provider, MD  labetalol (NORMODYNE) 200 MG tablet Take 600 mg by mouth 2 (two) times daily.    Historical Provider, MD  levETIRAcetam (KEPPRA) 1000 MG tablet Take 500 mg by mouth 2 (two) times daily.    Historical Provider, MD  lisinopril (PRINIVIL,ZESTRIL) 10 MG tablet Take 10 mg by mouth daily.      Historical Provider, MD  magnesium oxide (MAG-OX) 400 MG tablet Take 400 mg by mouth daily.    Historical Provider, MD  mycophenolate (CELLCEPT) 250 MG capsule Take 1,000 mg by mouth 2 (two) times daily.      Historical Provider, MD  prednisoLONE 5 MG TABS Take 5 mg by mouth daily.    Historical Provider, MD  tacrolimus (PROGRAF) 1 MG capsule Take 4 mg by mouth 2 (two) times daily.    Historical Provider, MD   BP 146/99 mmHg  Pulse 92  Temp(Src) 100.2 F (37.9 C)  Resp 16  SpO2 100% Physical Exam CONSTITUTIONAL: Well developed/well nourished HEAD: Normocephalic/atraumatic EYES: EOMI/PERRL ENMT: Mucous membranes moist, poor dentition, tenderness to right lower gingiva but no abscess noted.  No trismus.  No drooling.  Normal phonation.  Mild external facial swelling noted. NECK: supple no meningeal signs SPINE/BACK:entire spine nontender CV: S1/S2 noted, no murmurs/rubs/gallops noted LUNGS: Lungs are clear to auscultation bilaterally, no apparent distress ABDOMEN: soft, nontender, no rebound or guarding, bowel sounds noted throughout abdomen GU:no cva tenderness NEURO: Pt is awake/alert/appropriate, moves all extremitiesx4.  No facial droop.   EXTREMITIES: pulses normal/equal,  full ROM SKIN: warm, color normal PSYCH: no abnormalities of mood noted, alert and oriented to situation  ED Course  Procedures  5:16 PM Plan to start on oral antibiotics Will check labs to ensure at baseline He is already arranging dental f/u For his SZ, encouraged med compliance.  Will monitor in ED 6:15 PM Pt stable Vitals improved Labs near baseline I feel he is safe for d/c to take oral antibiotics He is arranging dental f/u   Medications  oxyCODONE-acetaminophen (PERCOCET/ROXICET) 5-325 MG per tablet 1 tablet (1 tablet Oral Given 12/27/14 1719)  clindamycin (CLEOCIN) capsule 300 mg (300 mg Oral Given 12/27/14 1719)    Labs Review Labs Reviewed  URINALYSIS, ROUTINE W REFLEX MICROSCOPIC - Abnormal; Notable for the following:    Hgb urine dipstick MODERATE (*)    Protein, ur 100 (*)    All other components within normal limits  URINE MICROSCOPIC-ADD ON - Abnormal; Notable for the following:    Bacteria, UA FEW (*)    All other components within normal limits  BASIC METABOLIC PANEL - Abnormal; Notable for the following:    Chloride 99 (*)    Glucose, Bld 118 (*)    Creatinine, Ser 1.62 (*)    GFR calc non Af Amer 54 (*)    All other components within normal limits  CBC WITH DIFFERENTIAL/PLATELET - Abnormal; Notable for the following:    WBC 12.6 (*)    Hemoglobin 12.9 (*)    HCT 36.3 (*)    MCV 71.2 (*)    MCH 25.3 (*)    Neutrophils Relative % 78 (*)    Neutro Abs 9.9 (*)    Monocytes Absolute 1.1 (*)    All other components within normal limits  URINE CULTURE     MDM   Final diagnoses:  Dental abscess    Nursing notes including past medical history and social history reviewed and considered in documentation Labs/vital reviewed myself and considered during evaluation     Zadie Rhineonald Ellyce Lafevers, MD 12/27/14 1816

## 2014-12-27 NOTE — Discharge Instructions (Signed)

## 2014-12-28 LAB — URINE CULTURE
COLONY COUNT: NO GROWTH
CULTURE: NO GROWTH

## 2015-05-18 ENCOUNTER — Encounter (HOSPITAL_BASED_OUTPATIENT_CLINIC_OR_DEPARTMENT_OTHER): Payer: Self-pay

## 2015-05-18 ENCOUNTER — Emergency Department (HOSPITAL_BASED_OUTPATIENT_CLINIC_OR_DEPARTMENT_OTHER)
Admission: EM | Admit: 2015-05-18 | Discharge: 2015-05-18 | Disposition: A | Discharge status: Home / Self Care | Payer: Medicare Other | Attending: Emergency Medicine | Admitting: Emergency Medicine

## 2015-05-18 DIAGNOSIS — Z79899 Other long term (current) drug therapy: Secondary | ICD-10-CM | POA: Diagnosis not present

## 2015-05-18 DIAGNOSIS — R51 Headache: Secondary | ICD-10-CM | POA: Insufficient documentation

## 2015-05-18 DIAGNOSIS — Z720 Tobacco use: Secondary | ICD-10-CM | POA: Insufficient documentation

## 2015-05-18 DIAGNOSIS — Z87448 Personal history of other diseases of urinary system: Secondary | ICD-10-CM | POA: Insufficient documentation

## 2015-05-18 DIAGNOSIS — Z8619 Personal history of other infectious and parasitic diseases: Secondary | ICD-10-CM | POA: Insufficient documentation

## 2015-05-18 DIAGNOSIS — K047 Periapical abscess without sinus: Secondary | ICD-10-CM | POA: Insufficient documentation

## 2015-05-18 DIAGNOSIS — R220 Localized swelling, mass and lump, head: Secondary | ICD-10-CM | POA: Diagnosis present

## 2015-05-18 MED ORDER — HYDROCODONE-ACETAMINOPHEN 5-325 MG PO TABS: 1.0000 | ORAL_TABLET | ORAL | Status: DC | PRN

## 2015-05-18 MED ORDER — HYDROCODONE-ACETAMINOPHEN 5-325 MG PO TABS
2.0000 | ORAL_TABLET | Freq: Once | ORAL | Status: AC
Start: 2015-05-18 — End: 2015-05-18
  Administered 2015-05-18: 2 via ORAL
  Filled 2015-05-18: qty 2

## 2015-05-18 MED ORDER — TRAMADOL HCL 50 MG PO TABS: 50.0000 mg | ORAL_TABLET | Freq: Four times a day (QID) | ORAL | Status: DC | PRN

## 2015-05-18 MED ORDER — CLINDAMYCIN HCL 150 MG PO CAPS
300.0000 mg | ORAL_CAPSULE | Freq: Once | ORAL | Status: AC
  Administered 2015-05-18: 300 mg via ORAL
  Filled 2015-05-18: qty 2

## 2015-05-18 MED ORDER — CLINDAMYCIN HCL 300 MG PO CAPS: 300.0000 mg | ORAL_CAPSULE | Freq: Four times a day (QID) | ORAL | Status: DC

## 2015-05-18 MED ORDER — TRAMADOL HCL 50 MG PO TABS: 50.0000 mg | ORAL_TABLET | Freq: Once | ORAL | Status: DC

## 2015-05-18 NOTE — ED Provider Notes (Signed)
CSN: [id number]     Arrival date & time 05/18/15  1932 History  By signing my name below, I, Marcus Greene, attest that this documentation has been prepared under the direction and in the presence of Rolan Bucco, MD. Electronically Signed: [name], ED Scribe. 05/18/2015. 8:15 PM.    Chief Complaint  Patient presents with  . Oral Swelling   The history is provided by the patient. No language interpreter was used.   HPI Comments: Marcus Greene is a 34 y.o. male who presents to the Emergency Department complaining of right lower jaw swelling onset this morning. He reports associated right lower dental pain (onset 1 day ago) and HA (onset today). He states he does not currently have a dentist. Pt denies fever and vomiting.  Past Medical History  Diagnosis Date  . Hypertension   . Kidney failure   . Thrush   . Seizures     last seizure 01/2013   Past Surgical History  Procedure Laterality Date  . Kidney transplant     No family history on file. Social History  Substance Use Topics  . Smoking status: Current Every Day Smoker -- 1.00 packs/day    Types: Cigarettes  . Smokeless tobacco: Never Used  . Alcohol Use: No    Review of Systems  Constitutional: Negative for fever.  HENT: Positive for dental problem and facial swelling.   Gastrointestinal: Negative for nausea and vomiting.  Skin: Negative for rash and wound.  Neurological: Positive for headaches.    Allergies  Review of patient's allergies indicates no known allergies.  Home Medications   Prior to Admission medications   Medication Sig Start Date End Date Taking? Authorizing Provider  amLODipine (NORVASC) 10 MG tablet Take 10 mg by mouth daily.      Historical Provider, MD  carbamazepine (TEGRETOL XR) 200 MG 12 hr tablet Take 200 mg by mouth 2 (two) times daily.    Historical Provider, MD  cholecalciferol (VITAMIN D) 1000 UNITS tablet Take 1,000 Units by mouth 2 (two) times daily.    Historical  Provider, MD  clindamycin (CLEOCIN) 300 MG capsule Take 1 capsule (300 mg total) by mouth 4 (four) times daily. X 7 days 05/18/15   Rolan Bucco, MD  cloNIDine (CATAPRES) 0.2 MG tablet Take 0.2 mg by mouth 3 (three) times daily.      Historical Provider, MD  ferrous sulfate 325 (65 FE) MG tablet Take 325 mg by mouth 2 (two) times daily.      Historical Provider, MD  HYDROcodone-acetaminophen (NORCO/VICODIN) 5-325 MG tablet Take 1-2 tablets by mouth every 4 (four) hours as needed. 05/18/15   Rolan Bucco, MD  labetalol (NORMODYNE) 200 MG tablet Take 600 mg by mouth 2 (two) times daily.    Historical Provider, MD  levETIRAcetam (KEPPRA) 1000 MG tablet Take 500 mg by mouth 2 (two) times daily.    Historical Provider, MD  lisinopril (PRINIVIL,ZESTRIL) 10 MG tablet Take 10 mg by mouth daily.      Historical Provider, MD  magnesium oxide (MAG-OX) 400 MG tablet Take 400 mg by mouth daily.    Historical Provider, MD  mycophenolate (CELLCEPT) 250 MG capsule Take 1,000 mg by mouth 2 (two) times daily.      Historical Provider, MD  prednisoLONE 5 MG TABS Take 5 mg by mouth daily.    Historical Provider, MD  tacrolimus (PROGRAF) 1 MG capsule Take 4 mg by mouth 2 (two) times daily.    Historical Provider, MD   BP  148/103 mmHg  Pulse 88  Temp(Src) 99.5 F (37.5 C) (Oral)  Resp 18  Ht  (1.727 m)  Wt 168 lb (76.204 kg)  BMI 25.55 kg/m2  SpO2 100% Physical Exam  Constitutional: He is oriented to person, place, and time. He appears well-developed and well-nourished.  HENT:  Mouth/Throat: Oropharynx is clear and moist.  Positive swelling around the right lower back molar. There is tenderness to this area. No induration or fluctuance is noted. No trismus. No drainage. There is a decayed back molar.  Cardiovascular: Normal rate and normal heart sounds.   No murmur heard. Pulmonary/Chest: Effort normal and breath sounds normal.  Neurological: He is alert and oriented to person, place, and time.  Skin:  Skin is warm and dry.    ED Course  Procedures  DIAGNOSTIC STUDIES: Oxygen Saturation is 100% on RA, normal by my interpretation.    COORDINATION OF CARE: 8:13 PM - Discussed plans to order antibiotics and pain medication. Will give a note for work and refer to a dentist. Advise to return to ED if symptoms worsen. Pt advised of plan for treatment and pt agrees.  Labs Review Labs Reviewed - No data to display  Imaging Review No results found. I have personally reviewed and evaluated these images and lab results as part of my medical decision-making.   EKG Interpretation None      MDM   Final diagnoses:  Periapical abscess    Patient was started on clindamycin and was given amount of Vicodin for pain. He was encouraged to have close follow-up with the dentist. He is advised to return here if he has any worsening symptoms or increased facial swelling.  I personally performed the services described in this documentation, which was scribed in my presence.  The recorded information has been reviewed and considered.   Rolan Bucco, MD 05/18/15 2022

## 2015-05-18 NOTE — ED Notes (Signed)
C/o swelling to right lower jaw x today

## 2015-05-18 NOTE — Discharge Instructions (Signed)
Dental Abscess °A dental abscess is a collection of infected fluid (pus) from a bacterial infection in the inner part of the tooth (pulp). It usually occurs at the end of the tooth's root.  °CAUSES  °· Severe tooth decay. °· Trauma to the tooth that allows bacteria to enter into the pulp, such as a broken or chipped tooth. °SYMPTOMS  °· Severe pain in and around the infected tooth. °· Swelling and redness around the abscessed tooth or in the mouth or face. °· Tenderness. °· Pus drainage. °· Bad breath. °· Bitter taste in the mouth. °· Difficulty swallowing. °· Difficulty opening the mouth. °· Nausea. °· Vomiting. °· Chills. °· Swollen neck glands. °DIAGNOSIS  °· A medical and dental history will be taken. °· An examination will be performed by tapping on the abscessed tooth. °· X-rays may be taken of the tooth to identify the abscess. °TREATMENT °The goal of treatment is to eliminate the infection. You may be prescribed antibiotic medicine to stop the infection from spreading. A root canal may be performed to save the tooth. If the tooth cannot be saved, it may be pulled (extracted) and the abscess may be drained.  °HOME CARE INSTRUCTIONS °· Only take over-the-counter or prescription medicines for pain, fever, or discomfort as directed by your caregiver. °· Rinse your mouth (gargle) often with salt water (¼ tsp salt in 8 oz [250 ml] of warm water) to relieve pain or swelling. °· Do not drive after taking pain medicine (narcotics). °· Do not apply heat to the outside of your face. °· Return to your dentist for further treatment as directed. °SEEK MEDICAL CARE IF: °· Your pain is not helped by medicine. °· Your pain is getting worse instead of better. °SEEK IMMEDIATE MEDICAL CARE IF: °· You have a fever or persistent symptoms for more than 2-3 days. °· You have a fever and your symptoms suddenly get worse. °· You have chills or a very bad headache. °· You have problems breathing or swallowing. °· You have trouble  opening your mouth. °· You have swelling in the neck or around the eye. °Document Released: 08/04/2005 Document Revised: 04/28/2012 Document Reviewed: 11/12/2010 °ExitCare® Patient Information ©2015 [employer]. This information is not intended to replace advice given to you by your health care provider. Make sure you discuss any questions you have with your health care provider. °Emergency Department Resource Guide °1) Find a Doctor and Pay Out of Pocket °Although you won't have to find out who is covered by your insurance plan, it is a good idea to ask around and get recommendations. You will then need to call the office and see if the doctor you have chosen will accept you as a new patient and what types of options they offer for patients who are self-pay. Some doctors offer discounts or will set up payment plans for their patients who do not have insurance, but you will need to ask so you aren't surprised when you get to your appointment. ° °2) Contact Your Local Health Department °Not all health departments have doctors that can see patients for sick visits, but many do, so it is worth a call to see if yours does. If you don't know where your local health department is, you can check in your phone book. The CDC also has a tool to help you locate your state's health department, and many state websites also have listings of all of their local health departments. ° °3) Find a Walk-in Clinic °If   your illness is not likely to be very severe or complicated, you may want to try a walk in clinic. These are popping up all over the country in pharmacies, drugstores, and shopping centers. They're usually staffed by nurse practitioners or physician assistants that have been trained to treat common illnesses and complaints. They're usually fairly quick and inexpensive. However, if you have serious medical issues or chronic medical problems, these are probably not your best option. ° ° °Chronic Pain  Problems: °Organization         Address     Phone             Notes  ° [facility]  (336) 297-2271 Patients need to be referred by their primary care doctor.  ° °Medication Assistance: °Organization         Address     Phone             Notes  °[place] [facility] [address] °[place], [place] [address] (336) 641-8030 --Must be a resident of [place] °-- Must have NO insurance coverage whatsoever (no Medicaid/ Medicare, etc.) °-- The pt. MUST have a primary care doctor that directs their care regularly and follows them in the community °  °[facility]  (866) 331-1348   °[employer]  (888) 892-1162   ° °Agencies that provide inexpensive medical care: °Organization         Address     Phone             Notes  °[facility]  (336) 832-8035   °[facility]    (336) 832-7272   °[facility] [address] °[place], [place] [address] (336) 832-4777   °[facility] [address], °[place] (336) 271-4999   °[facility]    (336) 373-0678   °[facility]    (336) 272-1050   °[facility] ° [address], [place] Phone:  (336) 832-4444, Fax:  (336) 832-4440 Hours of Operation:  9 am - 6 pm, M-F.  Also accepts Medicaid/Medicare and self-pay.  °[facility] ° [address], [place] Phone: (336) 832-3150, Fax: (336) [phone]. Hours of Operation:  8:30 am - 5:30 pm, M-F.  Also accepts Medicaid and self-pay.  °[facility] [place] [address], [place] Phone: (336) 878-6027   °[facility] ° ° ° ° [address], [place], [place] (336)723-1848, Ext. 123 Mondays & Thursdays: 7-9 AM.  First 15 patients are seen on a first come, first serve basis. °  °[facility] [address] °[place],  [address] (336) 349-3220 Accepts Medicaid  ° °Medicaid-accepting [place]  Providers: ° °Organization         Address     Phone             Notes  °[facility] [address], [place] (336) 641-2100 Also accepts self-pay patients.  °[facility] [address], [place] ° (336) 856-9996   °[facility] [address], [place] (336) 288-8857   °[facility] [address] [place] [address], [place] (336) 299-7000   °Veita Bland [address],   ° (336) 373-1557 Only accepts [place] Access Medicaid patients after they have their name applied to their card.  ° °  Self-Pay (no insurance) in [place]: ° °Organization         Address     Phone             Notes  °Sickle Cell Patients, [facility] [address], [place] (336) 832-1970   °[facility] [address], [place] (336) 832-4400   °[facility] ° [address],  (336) 992-4800   °[facility]/Dr. Osei-Bonsu ° [address] [place] [address], [place] or [address], [place] (336) [phone] Phone number for both [place] and [place] locations is the same.  °[facility] [address], [place] (336) 299-0000   °[facility] [address] [place] [address], [place] or [address] (336) 852-7530 °(336) 878-2260   °[facility] [address], [place] (336) 350-1642, phone; (336) 294-5005, fax Sees patients 1st and 3rd Saturday of every month.  Must not qualify for public or private insurance (i.e. Medicaid, Medicare, [place] Health Choice, Veterans' Benefits) • Household income should be no more than 200% of the poverty level •The clinic cannot treat you if you are pregnant or think you are pregnant • Sexually transmitted diseases are not treated at the clinic.  ° ° °Dental Care: ° °Organization         Address     Phone             Notes  °[facility] [address], [place] (336) 641-6152 Accepts children up to age 21 who are enrolled in Medicaid or [place] Health Choice; pregnant women with a Medicaid card; and children who have applied for Medicaid or [place] Health Choice, but were declined, whose parents can pay a reduced fee at time of service.  °[facility] [place]  [address], [place] (336) 641-7733 Accepts children up to age 21 who are enrolled in Medicaid or [place] Health Choice; pregnant women with a Medicaid card; and children who have applied for Medicaid or [place] Health Choice, but were declined, whose parents can pay a reduced fee at time of service.  °[facility] ° [address], [place] (336) 641-4533 Patients are seen by appointment only. Walk-ins are not accepted. [facility] will see patients 18 years of age and older. °Monday - Tuesday (8am-5pm) °Most Wednesdays (8:30-5pm) °$30 per visit, cash only  °[facility] ° [address], [place] (336) 641-4533 Patients are seen by appointment only. Walk-ins are not accepted. [facility] will see patients 18 years of age and older. °One Wednesday Evening (Monthly: Volunteer Based).  $30 per visit, cash only  °[facility]  (919) 537-3737 for adults; Children under age 4, call Graduate Pediatric Dentistry at ([phone]) [phone]. Children aged 4-14, please call (919) [phone] to request a pediatric application. ° Dental services are provided in all areas of dental care including fillings, crowns and bridges, complete and partial dentures, implants, gum treatment, root canals, and extractions. Preventive care is also provided. Treatment is provided to both adults and children. °Patients are selected via a lottery and there is often a waiting list. °  °[facility] [address], °[place] ° (336) 763-8833  [email or web address] °  °[facility] [address], [place], [place] (336)723-1848, Ext. 123 Second   and Fourth Thursday of each month, opens at 6:30 AM; Clinic ends at 9 AM.  Patients are seen on a first-come first-served basis, and a limited number are seen during each clinic.  ° °[facility] ° [address], [place], [place] (336) 723-7904   Eligibility Requirements °You must have lived in [place], [place], or [place] counties for at least the last three months. °  You cannot be eligible for state or federal sponsored healthcare insurance, including Veterans Administration, Medicaid, or Medicare. °  You generally cannot be eligible for healthcare insurance through your employer.  °  How to apply: °Eligibility screenings are held every Tuesday and Wednesday afternoon from 1:00 pm until 4:00 pm. You do not need an appointment for the interview!  °[facility] [address], [place], [place] 336-631-2330   °[facility]  336-342-8273   °[place]  336-703-3100   °[facility]  336-570-6415   ° °Behavioral Health Resources in the Community: °Intensive Outpatient Programs °Organization         Address     Phone             Notes  °[facility] [address], [place], [place] 336-878-6098   °[facility] [address], [place], [place] 336-832-9800   °[facility] [address], [place], [place] ° 336-882-2125   °[facility] [address],  °[place], [place] 1-800-853-5163 or 336-641-4981   ° ° °Substance Abuse Resources °Organization         Address     Phone             Notes  °[facility]  336-882-2125   °[facility]  336-784-9470   °[facility]  336-285-9073   °[facility]  336-845-3988   °Residential & Outpatient Substance Abuse Program  1-800-659-3381   °Psychological  Services °Organization         Address     Phone             Notes  °[facility]  336- 832-9600   °[facility]  336- 378-7881   °[facility] [address], [place] 1-800-853-5163 or 336-641-4981   ° °Mobile Crisis Teams °Organization         Address     Phone             Notes  °[facility], Mobile Crisis Care Unit  1-877-626-1772   °Assertive °[facility] ° [address] [place], [place] 336-834-9664   °Sharon DeEsch [address] °[place] 336-554-5454   ° °Self-Help/Support Groups °Organization         Address     Phone             Notes  °[facility] - variety of support groups  336- 373-1402 Call for more information  °Narcotics Anonymous (NA), [facility] [address], °[place]  2 meetings at this location  ° °Residential Treatment Programs °Organization         Address     Phone             Notes  °[facility] [address],    °[place]  1-866-801-8205   °[facility] ° [address], [place], [place] 704-293-8524   °[facility] [address], [place] 336-845-3988 Admissions: 8am-3pm M-F  °[facility] [address]    °[place], [place] 336-841-1104   °[facility] [address], [place], [place] 336-379-7146   °[facility] [address]  °[place], [place] 336-285-9073   °[facility] Intensive Outpatient [address], [place], [place] 336-852-3033   °[facility] [address]  °[place], [place] 1-877-615-2722 or 336-784-9470   °[facility] [address] [place], [place] 336-227-7417 Accepts Medicaid  °[facility] [address]  °[place] 1-800-659-3381 Substance Abuse/Addiction Treatment  ° °[place] Behavioral Health Resources °Organization         Address     Phone              Notes  °[facility]  (888) 581-9988   °Julie Brannon, PhD [address] [place], [place]   (336) 349-5553 or (336) 951-0000   °[facility]   [address] °[place], [place] (336) 349-4454   °[facility] [address], [place], [place] (336) 342-8316 Insurance/Medicaid/sponsorship through [facility]  °[facility] [address]                                    [place], [place] (336) 342-8316 Therapy/tele-psych/case  °[facility] [address]  ° [place], [place] (336) 349-2233    °Dr. Arfeen  (336) 349-4544   °[facility] [place]  [employer] [place] [facility] 1) [address],  °2) [address], [place] °3)  [address] [place] [address], [place] (336) 349-3220 °(336) 342-7768 ° °(336) 342-8140   °[place] Child Abuse Hotline (336) 342-1394 or (336) 342-3537 (After Hours)    ° °[place] Community Resources: °Abuse and Neglect °Organization         Address     Phone             Notes  °Child/Elder Abuse Hotline  336-229-2908   °[facility]  336-226-5985 24 hour crisis line  °[facility]  336-228-0360   °National Domestic Violence Hotline  800-799-7233   ° °Behavioral Health & Substance Use °Organization         Address     Phone             Notes  °[facility] °  800-939-5911 24 hour crisis line  °[facility]  [address], [place] 336-513-4200 Monday- Friday, walk-in, ° 8am-8pm  °[facility]  336-227-7417   °Alcoholics Anonymous (  888-237-3235 [place], [place], [place], [place]  °Narcotics Anonymous  866-375-1272 °   ° °Health Clinics & Urgent Care Centers °Organization         Address     Phone             Notes  °[facility]  336-227-0101   °[facility] [place]  919-568-7300   °[facility]  336-538-2358   °[facility]  336-570-9800 Uninsured  patients meeting eligibility requirement  °[facility]   °   [facility] °   Health Center  336-506-5840   °   [facility]  °   Health Center  336-570-3739   °   [facility] °     Health Center  336-562-3311   °   [facility]   336-421-3247 [address]  °   [facility] °   Center  336-506-0631   ° ° °Additional Community Resources °Organization         Address     Phone             Notes  °[facility]  336-532-0100   °[place] [facility]  336-570-6532 Medicaid, Nutrition, Medicine Assistance, Utility Assistance  °[place] [facility] ([facility])  336-222-0565   °[facility]  336-538-8080   °[facility]  336-229-4096 Men’s Homeless Shelter  °[facility]  336-229-0881 Adult & family shelter, food, utility & rent assistance  °24 Hour crisis line for those facing homelessness  336-350-9985   °[facility]  336-222-5465 [place], [place], ACC public transportation system  °[facility]  336-538-8557 HIV/AIDS Case Management, FREE HIV SCREEN  °[facility]  336-538-8440 Ongoing medication assistance for patients meeting eligibility requirements  °Medication Drop Box Locations: [place] Police Dept., [place] Police Dept., [place] Police Dept.,  County Sheriff’s office  Safely rid of unused medications  °[facility]  336-222-5529 Crisis assistance, medication, housing, food, utility assistance  °Seniors’ Health Insurance Info.  (SHIIP)  800-443-9354   ° ° ° °  °   °

## 2015-08-27 ENCOUNTER — Emergency Department (HOSPITAL_BASED_OUTPATIENT_CLINIC_OR_DEPARTMENT_OTHER)
Admission: EM | Admit: 2015-08-27 | Discharge: 2015-08-27 | Disposition: A | Discharge status: Home / Self Care | Payer: Medicare Other | Attending: Emergency Medicine | Admitting: Emergency Medicine

## 2015-08-27 ENCOUNTER — Encounter (HOSPITAL_BASED_OUTPATIENT_CLINIC_OR_DEPARTMENT_OTHER): Payer: Self-pay

## 2015-08-27 DIAGNOSIS — Z87448 Personal history of other diseases of urinary system: Secondary | ICD-10-CM | POA: Insufficient documentation

## 2015-08-27 DIAGNOSIS — Z7952 Long term (current) use of systemic steroids: Secondary | ICD-10-CM | POA: Insufficient documentation

## 2015-08-27 DIAGNOSIS — J069 Acute upper respiratory infection, unspecified: Secondary | ICD-10-CM | POA: Insufficient documentation

## 2015-08-27 DIAGNOSIS — Z8619 Personal history of other infectious and parasitic diseases: Secondary | ICD-10-CM | POA: Insufficient documentation

## 2015-08-27 DIAGNOSIS — I10 Essential (primary) hypertension: Secondary | ICD-10-CM | POA: Diagnosis not present

## 2015-08-27 DIAGNOSIS — F17210 Nicotine dependence, cigarettes, uncomplicated: Secondary | ICD-10-CM | POA: Diagnosis not present

## 2015-08-27 DIAGNOSIS — Z79899 Other long term (current) drug therapy: Secondary | ICD-10-CM | POA: Insufficient documentation

## 2015-08-27 DIAGNOSIS — R05 Cough: Secondary | ICD-10-CM | POA: Diagnosis present

## 2015-08-27 NOTE — ED Provider Notes (Signed)
CSN: [id number]     Arrival date & time 08/27/15  1310 History   First MD Initiated Contact with Patient 08/27/15 1338     Chief Complaint  Patient presents with  . URI   (Consider location/radiation/quality/duration/timing/severity/associated sxs/prior Treatment) HPI 35 y.o. male with a hx of a kidney transplant in 2005, presents to the Emergency Department today complaining of URI symptoms. States that he has bee congested, sinus pressure, runny nose, cough with productive sputum since yesterday. States that his girlfriend is sick. No fevers noted. No N/V/D. No appetite change. Pt feels short of breath.   Past Medical History  Diagnosis Date  . Hypertension   . Kidney failure   . Thrush   . Seizures (HCC)     last seizure 01/2013   Past Surgical History  Procedure Laterality Date  . Kidney transplant     No family history on file. Social History  Substance Use Topics  . Smoking status: Current Every Day Smoker -- 1.00 packs/day    Types: Cigarettes  . Smokeless tobacco: Never Used  . Alcohol Use: No    Review of Systems 10 Systems reviewed and all are negative for acute change except as noted in the HPI.  Allergies  Review of patient's allergies indicates no known allergies.  Home Medications   Prior to Admission medications   Medication Sig Start Date End Date Taking? Authorizing Provider  amLODipine (NORVASC) 10 MG tablet Take 10 mg by mouth daily.      Historical Provider, MD  carbamazepine (TEGRETOL XR) 200 MG 12 hr tablet Take 200 mg by mouth 2 (two) times daily.    Historical Provider, MD  cholecalciferol (VITAMIN D) 1000 UNITS tablet Take 1,000 Units by mouth 2 (two) times daily.    Historical Provider, MD  clindamycin (CLEOCIN) 300 MG capsule Take 1 capsule (300 mg total) by mouth 4 (four) times daily. X 7 days 05/18/15   Rolan BuccoMelanie Belfi, MD  cloNIDine (CATAPRES) 0.2 MG tablet Take 0.2 mg by mouth 3 (three) times daily.      Historical Provider, MD  ferrous sulfate  325 (65 FE) MG tablet Take 325 mg by mouth 2 (two) times daily.      Historical Provider, MD  HYDROcodone-acetaminophen (NORCO/VICODIN) 5-325 MG tablet Take 1-2 tablets by mouth every 4 (four) hours as needed. 05/18/15   Rolan BuccoMelanie Belfi, MD  labetalol (NORMODYNE) 200 MG tablet Take 600 mg by mouth 2 (two) times daily.    Historical Provider, MD  levETIRAcetam (KEPPRA) 1000 MG tablet Take 500 mg by mouth 2 (two) times daily.    Historical Provider, MD  lisinopril (PRINIVIL,ZESTRIL) 10 MG tablet Take 10 mg by mouth daily.      Historical Provider, MD  magnesium oxide (MAG-OX) 400 MG tablet Take 400 mg by mouth daily.    Historical Provider, MD  mycophenolate (CELLCEPT) 250 MG capsule Take 1,000 mg by mouth 2 (two) times daily.      Historical Provider, MD  prednisoLONE 5 MG TABS Take 5 mg by mouth daily.    Historical Provider, MD  tacrolimus (PROGRAF) 1 MG capsule Take 4 mg by mouth 2 (two) times daily.    Historical Provider, MD   BP 151/91 mmHg  Pulse 88  Temp(Src) 97.9 F (36.6 C) (Oral)  Resp 16  SpO2 100%   Physical Exam  Constitutional: He is oriented to person, place, and time. He appears well-developed and well-nourished.  HENT:  Head: Normocephalic and atraumatic.  Right Ear: Hearing, tympanic  membrane, external ear and ear canal normal.  Left Ear: Hearing, tympanic membrane, external ear and ear canal normal.  Nose: Right sinus exhibits frontal sinus tenderness. Left sinus exhibits frontal sinus tenderness.  Mouth/Throat: Uvula is midline, oropharynx is clear and moist and mucous membranes are normal.  Eyes: EOM are normal.  Neck: Neck supple.  Cardiovascular: Normal rate and regular rhythm.   Pulmonary/Chest: Effort normal.  Abdominal: Soft.  Musculoskeletal: Normal range of motion.  Neurological: He is alert and oriented to person, place, and time.  Skin: Skin is warm and dry.  Psychiatric: He has a normal mood and affect. His behavior is normal. Thought content normal.   Nursing note and vitals reviewed.   ED Course  Procedures (including critical care time) Labs Review Labs Reviewed - No data to display  Imaging Review No results found. I have personally reviewed and evaluated these images and lab results as part of my medical decision-making.   EKG Interpretation None      MDM  I have reviewed the relevant previous healthcare records. I obtained HPI from historian.  ED Course:  Assessment: 29y M with hx of kidney transplant in 2005 presents with URI since yesterday. Symptoms are cough w/ productive sputum, dyspnea, sinus pressure, and rhinorrhea. Afebrile. Most likely viral in etiology due to sick person contact. Low threshold to treat with ABX even if immunocompromised. Will have him treat symptomatically and follow up with PCP if symptoms worsen Patient is in no acute distress. Vital Signs are stable. Patient is able to ambulate. Patient able to tolerate PO.   Disposition/Plan:  DC Home Additional Verbal discharge instructions given and discussed with patient.  Pt Instructed to f/u with PCP in the next 48 hours for evaluation and treatment of symptoms. Return precautions given Pt acknowledges and agrees with plan   Supervising Physician Azalia Bilis, MD   Final diagnoses:  URI (upper respiratory infection)     Audry Pili, PA-C 08/27/15 1402  Azalia Bilis, MD 08/27/15 [phone]

## 2015-08-27 NOTE — ED Notes (Signed)
C/o head congestion x 2 days

## 2015-08-27 NOTE — Discharge Instructions (Signed)
Please read and follow all provided instructions.  Your diagnoses today include:  1. URI (upper respiratory infection)    You appear to have an upper respiratory infection (URI). An upper respiratory tract infection, or cold, is a viral infection of the air passages leading to the lungs. It should improve gradually after 5-7 days. You may have a lingering cough that lasts for 2- 4 weeks after the infection.  Tests performed today include:  Vital signs. See below for your results today.   Medications prescribed:   Take any prescribed medications only as directed. Treatment for your infection is aimed at treating the symptoms. There are no medications, such as antibiotics, that will cure your infection.   Home care instructions:  Follow any educational materials contained in this packet.   Your illness is contagious and can be spread to others, especially during the first 3 or 4 days. It cannot be cured by antibiotics or other medicines. Take basic precautions such as washing your hands often, covering your mouth when you cough or sneeze, and avoiding public places where you could spread your illness to others.   Please continue drinking plenty of fluids.  Use over-the-counter medicines as needed as directed on packaging for symptom relief.  You may also use ibuprofen or tylenol as directed on packaging for pain or fever.  Do not take multiple medicines containing Tylenol or acetaminophen to avoid taking too much of this medication.  Follow-up instructions: Please follow-up with your primary care provider in the next 3 days for further evaluation of your symptoms if you are not feeling better.   Return instructions:   Please return to the Emergency Department if you experience worsening symptoms.   RETURN IMMEDIATELY IF you develop shortness of breath, confusion or altered mental status, a new rash, become dizzy, faint, or poorly responsive, or are unable to be cared for at home.  Please  return if you have persistent vomiting and cannot keep down fluids or develop a fever that is not controlled by tylenol or motrin.    Please return if you have any other emergent concerns.  Additional Information:  Your vital signs today were: BP 151/91 mmHg   Pulse 88   Temp(Src) 97.9 F (36.6 C) (Oral)   Resp 16   SpO2 100% If your blood pressure (BP) was elevated above 135/85 this visit, please have this repeated by your doctor within one month. --------------

## 2016-05-13 IMAGING — CT CT ORBITS W/O CM
3 series · 14 of 47 positions shown, 16 images · non-contrast
Comparison: CT of the head 10/09/2011

CLINICAL DATA: Left orbital pain.  Left headache, earache.

EXAM:
CT ORBITS WITHOUT CONTRAST
TECHNIQUE: Multidetector CT imaging of the orbits was performed following the
standard protocol without intravenous contrast.

[Series 3: orbits 2.0 h30s st · axial · 0.30mm/px · z∈[-95,-21]mm · 8 of 43 slices shown, 10 images]
[im 3/43  brain]
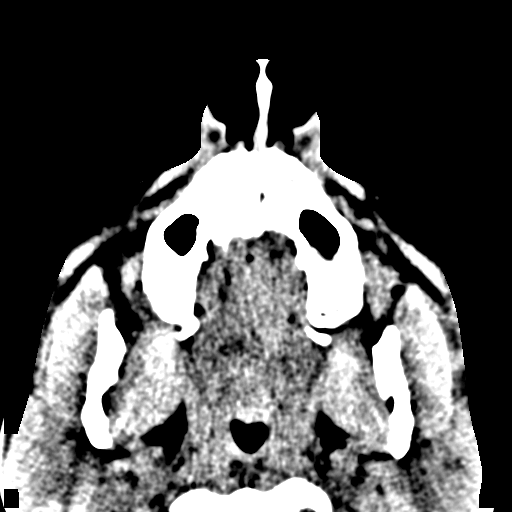
[im 3/43  bone]
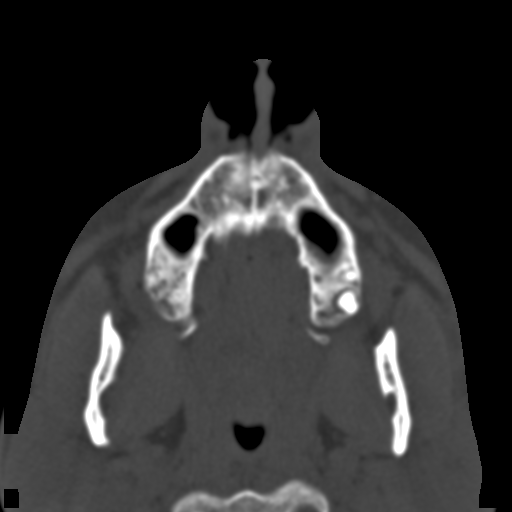
[im 9/43  bone]
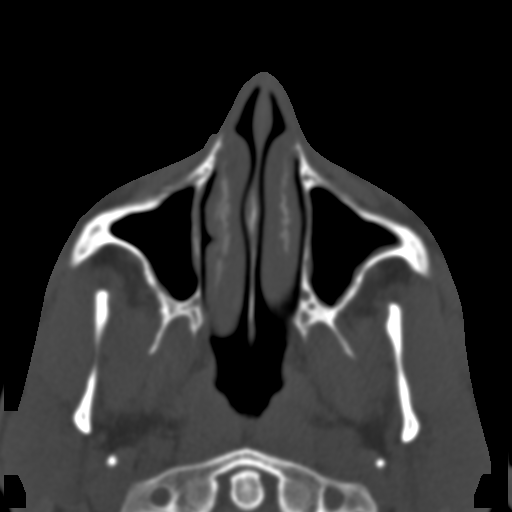
[im 14/43  bone]
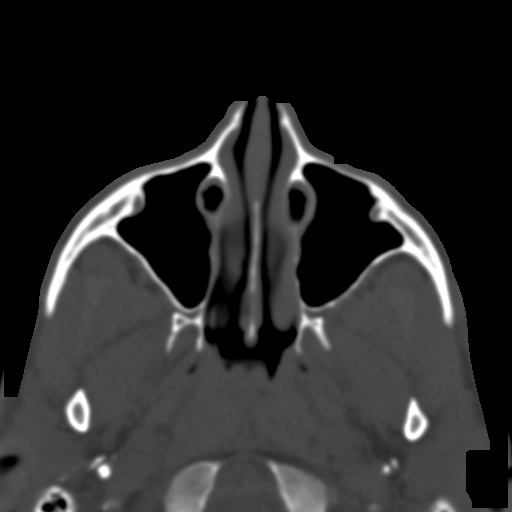
[im 19/43  bone]
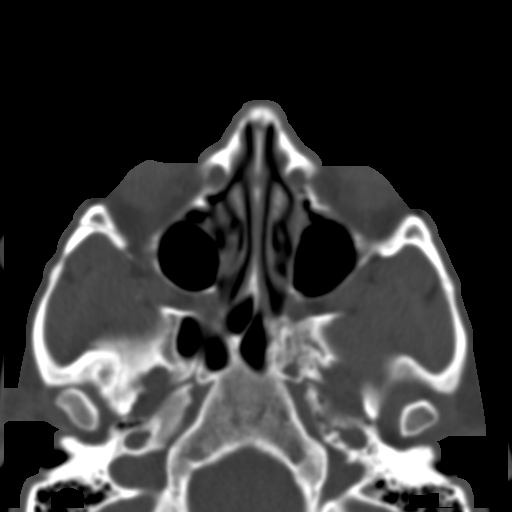
[im 24/43  brain]
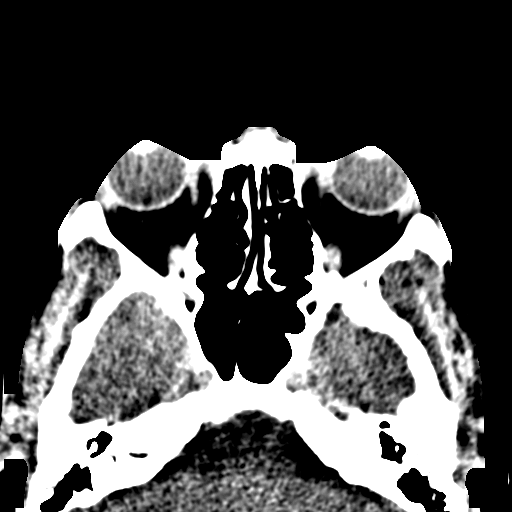
[im 24/43  bone]
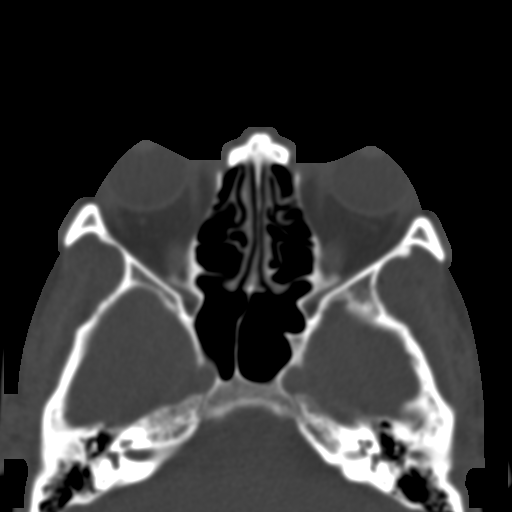
[im 29/43  bone]
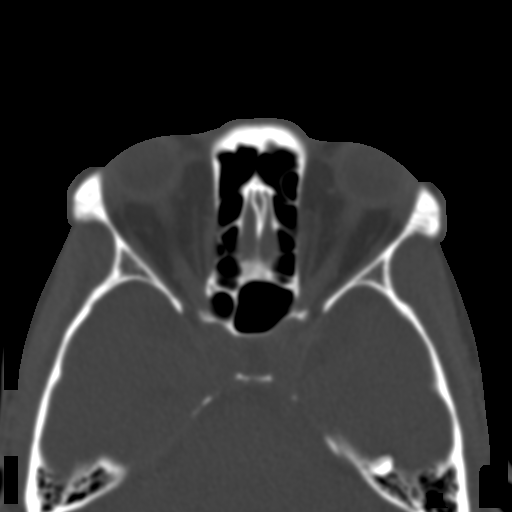
[im 34/43  bone]
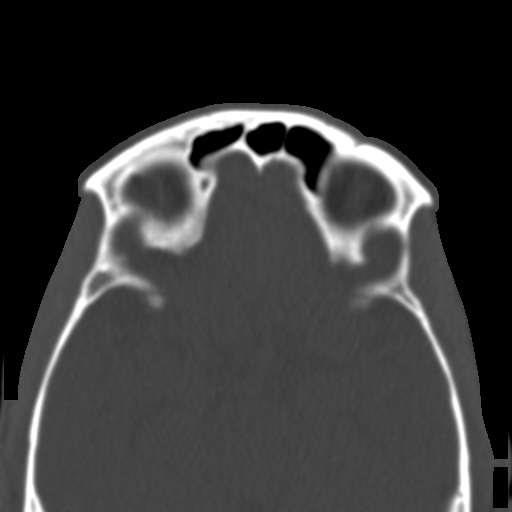
[im 40/43  bone]
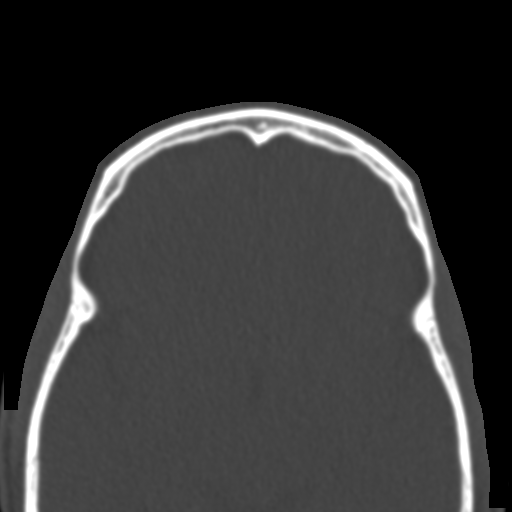

[Series 8: orbits 2.0 coronal · coronal · 0.19mm/px · 3 of 60 slices shown]
[im 20/60  bone]
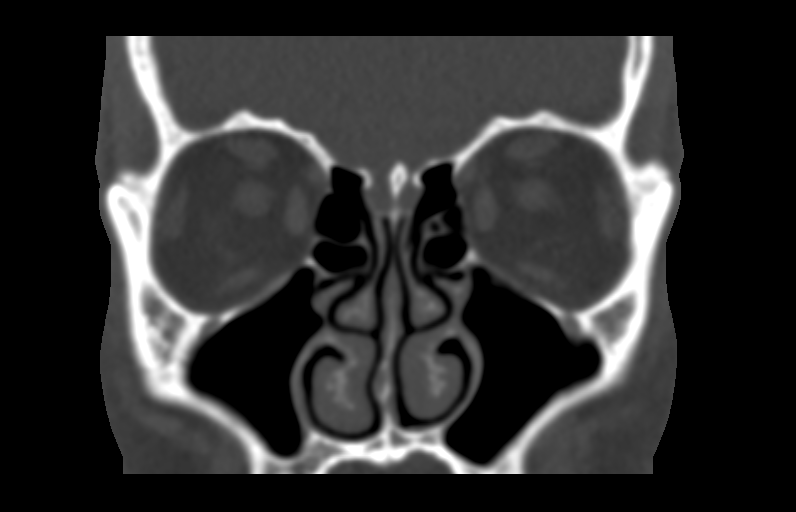
[im 27/60  bone]
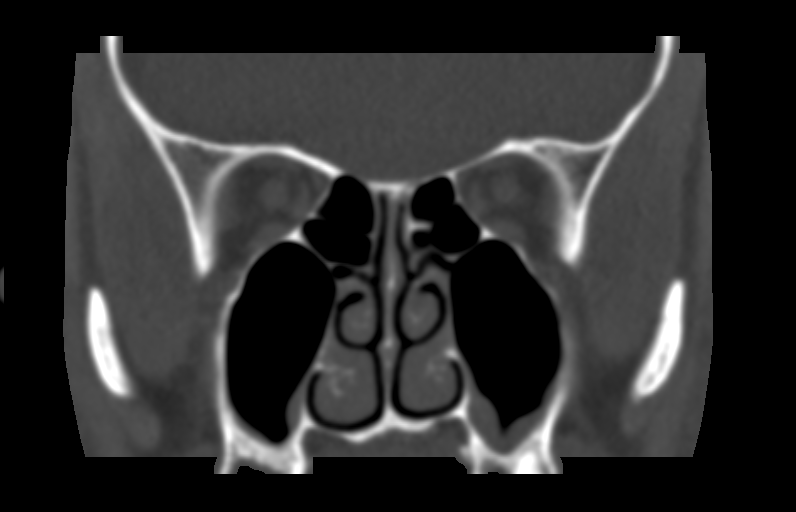
[im 33/60  bone]
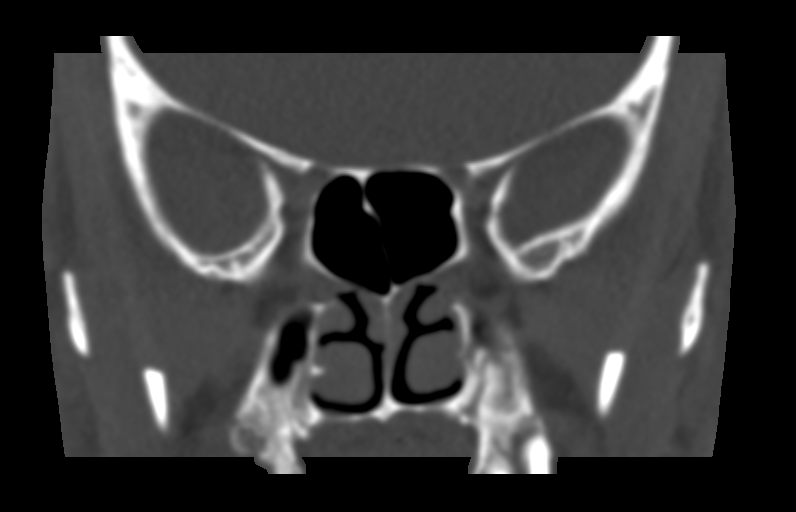

[Series 9: orbits 2.0 sagittal · sagittal · 0.20mm/px · 3 of 76 slices shown]
[im 26/76  bone]
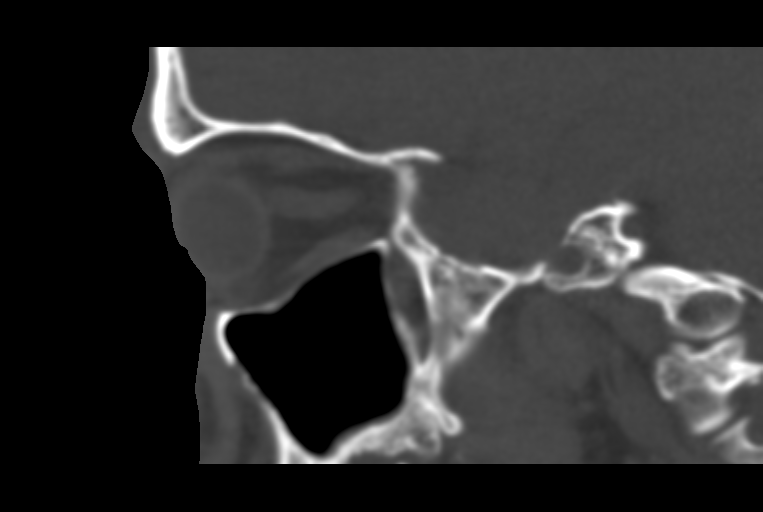
[im 38/76  bone]
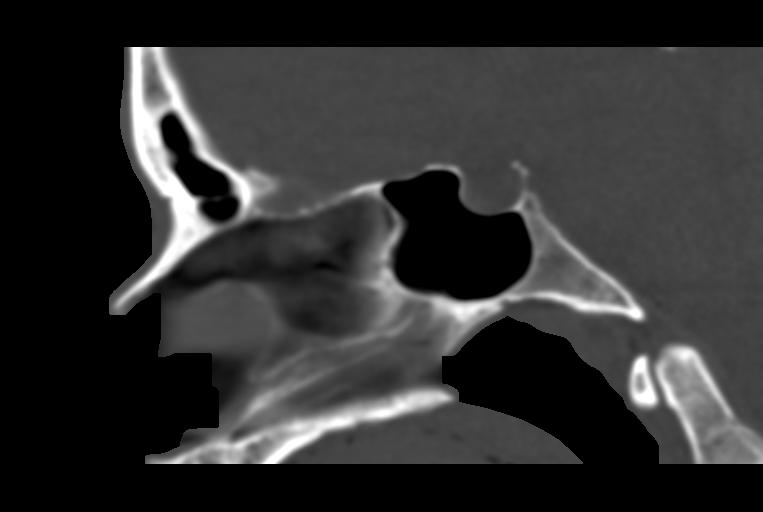
[im 51/76  bone]
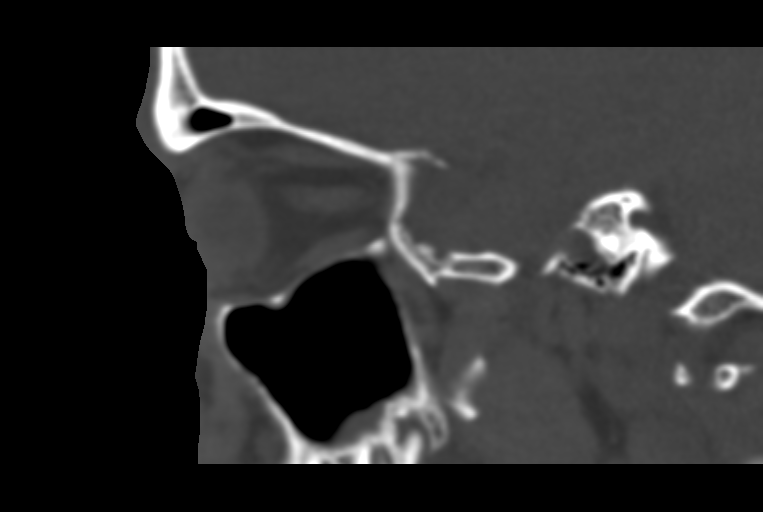

[14 of 47 positions shown; findings below may reference images not displayed]

FINDINGS: Globes are unremarkable. Orbital soft tissues symmetric and
unremarkable. No evidence of focal fluid collection to suggest
orbital abscess or other abnormality. Bony structures are intact.
Mild mucosal thickening in the maxillary sinuses. No air-fluid
levels. Mastoid air cells are clear.
IMPRESSION: No orbital abnormality.

Mild chronic maxillary sinusitis.

## 2016-09-14 ENCOUNTER — Encounter (HOSPITAL_BASED_OUTPATIENT_CLINIC_OR_DEPARTMENT_OTHER): Payer: Self-pay | Admitting: Emergency Medicine

## 2016-09-14 ENCOUNTER — Emergency Department (HOSPITAL_BASED_OUTPATIENT_CLINIC_OR_DEPARTMENT_OTHER): Payer: Medicare Other

## 2016-09-14 ENCOUNTER — Emergency Department (HOSPITAL_BASED_OUTPATIENT_CLINIC_OR_DEPARTMENT_OTHER)
Admission: EM | Admit: 2016-09-14 | Discharge: 2016-09-14 | Disposition: A | Discharge status: Home / Self Care | Payer: Medicare Other | Attending: Emergency Medicine | Admitting: Emergency Medicine

## 2016-09-14 DIAGNOSIS — I10 Essential (primary) hypertension: Secondary | ICD-10-CM | POA: Diagnosis not present

## 2016-09-14 DIAGNOSIS — F17210 Nicotine dependence, cigarettes, uncomplicated: Secondary | ICD-10-CM | POA: Diagnosis not present

## 2016-09-14 DIAGNOSIS — R0981 Nasal congestion: Secondary | ICD-10-CM | POA: Diagnosis present

## 2016-09-14 DIAGNOSIS — J069 Acute upper respiratory infection, unspecified: Secondary | ICD-10-CM | POA: Insufficient documentation

## 2016-09-14 MED ORDER — FLUTICASONE PROPIONATE 50 MCG/ACT NA SUSP: 2.0000 | Freq: Every day | NASAL | 0 refills | Status: DC

## 2016-09-14 MED ORDER — BENZONATATE 100 MG PO CAPS: 100.0000 mg | ORAL_CAPSULE | Freq: Three times a day (TID) | ORAL | 0 refills | Status: DC

## 2016-09-14 NOTE — ED Notes (Signed)
Pt given d/c instructions as per chart. Rx x 2. Verbalizes understanding. No questions. 

## 2016-09-14 NOTE — ED Provider Notes (Signed)
[facility] Provider Note   CSN: [id number] Arrival date & time: 09/14/16  1229  By signing my name below, I, Modena JanskyAlbert Thayil, attest that this documentation has been prepared under the direction and in the presence of Alvira MondayErin Markeis Allman, MD. Electronically Signed: Modena JanskyAlbert Thayil, Scribe. 09/14/2016. 3:48 PM.  History   Chief Complaint Chief Complaint  Patient presents with  . Nasal Congestion   The history is provided by the patient. No language interpreter was used.   HPI Comments: Marcus PersonsChristopher Greene is a 36 y.o. male with a PMHx of renal transplant who presents to the Emergency Department complaining of intermittent moderate cough that started a few days ago. He states he has been having gradually worsening URI-like symptoms. He describes the cough as productive of yellow sputum. He reports associated ear pain (right) and rhinorrhea. He denies any fever, chills, myalgias, sore throat, abdominal pain, chest pain, nausea, vomiting, diarrhea, dysuria, or headache.     PCP: Carole CivilHUSSAIN, SOPHIA A, MD  Past Medical History:  Diagnosis Date  . Hypertension   . Kidney failure   . Seizures (HCC)    last seizure 01/2013  . Thrush     There are no active problems to display for this patient.   Past Surgical History:  Procedure Laterality Date  . KIDNEY TRANSPLANT         Home Medications    Prior to Admission medications   Medication Sig Start Date End Date Taking? Authorizing Provider  amLODipine (NORVASC) 10 MG tablet Take 10 mg by mouth daily.      Historical Provider, MD  benzonatate (TESSALON) 100 MG capsule Take 1 capsule (100 mg total) by mouth every 8 (eight) hours. 09/14/16   Alvira MondayErin Lafreda Casebeer, MD  carbamazepine (TEGRETOL XR) 200 MG 12 hr tablet Take 200 mg by mouth 2 (two) times daily.    Historical Provider, MD  cholecalciferol (VITAMIN D) 1000 UNITS tablet Take 1,000 Units by mouth 2 (two) times daily.    Historical Provider, MD  clindamycin (CLEOCIN) 300 MG  capsule Take 1 capsule (300 mg total) by mouth 4 (four) times daily. X 7 days 05/18/15   Rolan BuccoMelanie Belfi, MD  cloNIDine (CATAPRES) 0.2 MG tablet Take 0.2 mg by mouth 3 (three) times daily.      Historical Provider, MD  ferrous sulfate 325 (65 FE) MG tablet Take 325 mg by mouth 2 (two) times daily.      Historical Provider, MD  fluticasone (FLONASE) 50 MCG/ACT nasal spray Place 2 sprays into both nostrils daily. 09/14/16   Alvira MondayErin Jazelyn Sipe, MD  HYDROcodone-acetaminophen (NORCO/VICODIN) 5-325 MG tablet Take 1-2 tablets by mouth every 4 (four) hours as needed. 05/18/15   Rolan BuccoMelanie Belfi, MD  labetalol (NORMODYNE) 200 MG tablet Take 600 mg by mouth 2 (two) times daily.    Historical Provider, MD  levETIRAcetam (KEPPRA) 1000 MG tablet Take 500 mg by mouth 2 (two) times daily.    Historical Provider, MD  lisinopril (PRINIVIL,ZESTRIL) 10 MG tablet Take 10 mg by mouth daily.      Historical Provider, MD  magnesium oxide (MAG-OX) 400 MG tablet Take 400 mg by mouth daily.    Historical Provider, MD  mycophenolate (CELLCEPT) 250 MG capsule Take 1,000 mg by mouth 2 (two) times daily.      Historical Provider, MD  prednisoLONE 5 MG TABS Take 5 mg by mouth daily.    Historical Provider, MD  tacrolimus (PROGRAF) 1 MG capsule Take 4 mg by mouth 2 (two) times daily.  Historical Provider, MD    Family History History reviewed. No pertinent family history.  Social History Social History  Substance Use Topics  . Smoking status: Current Every Day Smoker    Packs/day: 1.00    Types: Cigarettes  . Smokeless tobacco: Never Used  . Alcohol use No     Allergies   Patient has no known allergies.   Review of Systems Review of Systems  Constitutional: Negative for fever.  HENT: Positive for congestion (Nasal), ear pain (Right) and rhinorrhea.   Respiratory: Positive for cough (Productive).   Cardiovascular: Negative for chest pain.  Gastrointestinal: Negative for abdominal pain, diarrhea, nausea and vomiting.    Genitourinary: Negative for dysuria.  Musculoskeletal: Negative for myalgias.  Neurological: Negative for headaches.     Physical Exam Updated Vital Signs BP 151/99 (BP Location: Right Arm)   Pulse 84   Temp 98.2 F (36.8 C) (Oral)   Resp 18   Ht 5\' 7"  (1.702 m)   Wt 170 lb (77.1 kg)   SpO2 100%   BMI 26.63 kg/m   Physical Exam  Constitutional: He appears well-developed and well-nourished. No distress.  HENT:  Head: Normocephalic and atraumatic.  Right Ear: Tympanic membrane normal.  Left Ear: Tympanic membrane normal.  Mouth/Throat: Uvula is midline, oropharynx is clear and moist and mucous membranes are normal. No posterior oropharyngeal edema or posterior oropharyngeal erythema. No tonsillar exudate.  Eyes: Conjunctivae are normal.  Neck: Neck supple.  Cardiovascular: Normal rate and regular rhythm.   Pulmonary/Chest: Effort normal. No stridor. No respiratory distress. He has no wheezes. He has no rales.  Abdominal: Soft.  Musculoskeletal: Normal range of motion.  Neurological: He is alert.  Skin: Skin is warm and dry.  Psychiatric: He has a normal mood and affect.  Nursing note and vitals reviewed.    ED Treatments / Results  DIAGNOSTIC STUDIES: Oxygen Saturation is 100% on RA, normal by my interpretation.    COORDINATION OF CARE: 3:52 PM- Pt advised of plan for treatment and pt agrees.  Labs (all labs ordered are listed, but only abnormal results are displayed) Labs Reviewed - No data to display  EKG  EKG Interpretation None       Radiology Dg Chest 2 View  Result Date: 09/14/2016 CLINICAL DATA:  Patient with cough and congestion. EXAM: CHEST  2 VIEW COMPARISON:  Chest radiograph 07/24/2013. FINDINGS: Stable cardiac and mediastinal contours. Unchanged blunting right costophrenic angle. No consolidative pulmonary opacities. No pleural effusion or pneumothorax. IMPRESSION: No active cardiopulmonary disease. Electronically Signed   By: Annia Belt M.D.    On: 09/14/2016 15:43    Procedures Procedures (including critical care time)  Medications Ordered in ED Medications - No data to display   Initial Impression / Assessment and Plan / ED Course  I have reviewed the triage vital signs and the nursing notes.  Pertinent labs & imaging results that were available during my care of the patient were reviewed by me and considered in my medical decision making (see chart for details).     35 year old male with a history of renal transplant presents with concern for cough and nasal congestion. Chest x-ray shows no sign of pneumonia. There is no sign of acute bacterial sinusitis or otitis. Patient is afebrile, without myalgias, low suspicion for influenza. He denies any nausea, vomiting, diarrhea, change in urination, and do not feel he requires further evaluation of electrolytes or renal function given symptoms today. Patient most likely with viral URI. He is given  Tessalon Perles and Flonase, and recommended to follow-up with his primary care physician and nephrologist as scheduled.  Final Clinical Impressions(s) / ED Diagnoses   Final diagnoses:  Viral upper respiratory tract infection    New Prescriptions New Prescriptions   BENZONATATE (TESSALON) 100 MG CAPSULE    Take 1 capsule (100 mg total) by mouth every 8 (eight) hours.   FLUTICASONE (FLONASE) 50 MCG/ACT NASAL SPRAY    Place 2 sprays into both nostrils daily.   I personally performed the services described in this documentation, which was scribed in my presence. The recorded information has been reviewed and is accurate.    Alvira Monday, MD 09/15/16 [phone]

## 2016-09-14 NOTE — ED Notes (Signed)
ED Provider at bedside. 

## 2016-09-14 NOTE — ED Triage Notes (Signed)
Patient states that he has a cold with chest and nasal congestion watery eyes.x 2 days

## 2023-07-02 ENCOUNTER — Inpatient Hospital Stay (HOSPITAL_BASED_OUTPATIENT_CLINIC_OR_DEPARTMENT_OTHER)
Admission: EM | Admit: 2023-07-02 | Discharge: 2023-07-05 | DRG: 193 | Disposition: A | Discharge status: Home / Self Care | Payer: Managed Care, Other (non HMO) | Attending: Internal Medicine | Admitting: Internal Medicine

## 2023-07-02 ENCOUNTER — Emergency Department (HOSPITAL_BASED_OUTPATIENT_CLINIC_OR_DEPARTMENT_OTHER): Payer: Managed Care, Other (non HMO)

## 2023-07-02 ENCOUNTER — Encounter (HOSPITAL_BASED_OUTPATIENT_CLINIC_OR_DEPARTMENT_OTHER): Payer: Self-pay

## 2023-07-02 ENCOUNTER — Other Ambulatory Visit: Payer: Self-pay

## 2023-07-02 DIAGNOSIS — Z79899 Other long term (current) drug therapy: Secondary | ICD-10-CM

## 2023-07-02 DIAGNOSIS — E663 Overweight: Secondary | ICD-10-CM | POA: Diagnosis present

## 2023-07-02 DIAGNOSIS — Z79621 Long term (current) use of calcineurin inhibitor: Secondary | ICD-10-CM

## 2023-07-02 DIAGNOSIS — I10 Essential (primary) hypertension: Secondary | ICD-10-CM | POA: Diagnosis present

## 2023-07-02 DIAGNOSIS — G4733 Obstructive sleep apnea (adult) (pediatric): Secondary | ICD-10-CM | POA: Diagnosis present

## 2023-07-02 DIAGNOSIS — Z79624 Long term (current) use of inhibitors of nucleotide synthesis: Secondary | ICD-10-CM

## 2023-07-02 DIAGNOSIS — D84821 Immunodeficiency due to drugs: Secondary | ICD-10-CM | POA: Diagnosis present

## 2023-07-02 DIAGNOSIS — Z6826 Body mass index (BMI) 26.0-26.9, adult: Secondary | ICD-10-CM

## 2023-07-02 DIAGNOSIS — F17210 Nicotine dependence, cigarettes, uncomplicated: Secondary | ICD-10-CM | POA: Diagnosis present

## 2023-07-02 DIAGNOSIS — J189 Pneumonia, unspecified organism: Principal | ICD-10-CM | POA: Diagnosis present

## 2023-07-02 DIAGNOSIS — Z940 Kidney transplant status: Secondary | ICD-10-CM

## 2023-07-02 DIAGNOSIS — F17200 Nicotine dependence, unspecified, uncomplicated: Secondary | ICD-10-CM

## 2023-07-02 DIAGNOSIS — Z888 Allergy status to other drugs, medicaments and biological substances status: Secondary | ICD-10-CM

## 2023-07-02 DIAGNOSIS — Z1152 Encounter for screening for COVID-19: Secondary | ICD-10-CM

## 2023-07-02 DIAGNOSIS — Z716 Tobacco abuse counseling: Secondary | ICD-10-CM

## 2023-07-02 DIAGNOSIS — J9601 Acute respiratory failure with hypoxia: Secondary | ICD-10-CM

## 2023-07-02 DIAGNOSIS — G40909 Epilepsy, unspecified, not intractable, without status epilepticus: Secondary | ICD-10-CM | POA: Diagnosis present

## 2023-07-02 MED ORDER — ALBUTEROL SULFATE HFA 108 (90 BASE) MCG/ACT IN AERS
2.0000 | INHALATION_SPRAY | Freq: Once | RESPIRATORY_TRACT | Status: AC
  Administered 2023-07-03: 2 via RESPIRATORY_TRACT
  Filled 2023-07-02: qty 6.7

## 2023-07-02 NOTE — ED Provider Notes (Signed)
[facility] Provider Note   CSN: [id number] Arrival date & time: 07/02/23  2317     History  Chief Complaint  Patient presents with   Cough    Marcus Greene is a 42 y.o. male.  Patient with a history of hypertension, kidney transplant in 2010 on immunosuppression presenting with a 2-week history of cough and shortness of breath.  States c cough productive of yellow mucus associate with shortness of breath, chest congestion, body aches, chills and loss of taste.  No documented fever at home.  Temperature 99 on arrival.  Denies history of asthma or COPD but does smoke.  No chest pain.  No abdominal pain, vomiting, diarrhea, pain with urination or blood in the urine.  No history of asthma or COPD.  Using over-the-counter cough remedies at home without relief.  Still feels congested in his nose and short of breath and coughing.  No leg swelling. No issues with his kidney transplant that he is aware of.  The history is provided by the patient.  Cough Associated symptoms: myalgias and shortness of breath   Associated symptoms: no chest pain, no fever, no headaches, no rash and no sore throat        Home Medications Prior to Admission medications   Medication Sig Start Date End Date Taking? Authorizing Provider  amLODipine (NORVASC) 10 MG tablet Take 10 mg by mouth daily.      [provider]  benzonatate (TESSALON) 100 MG capsule Take 1 capsule (100 mg total) by mouth every 8 (eight) hours. 09/14/16   Alvira Monday, MD  carbamazepine (TEGRETOL XR) 200 MG 12 hr tablet Take 200 mg by mouth 2 (two) times daily.    [provider]  cholecalciferol (VITAMIN D) 1000 UNITS tablet Take 1,000 Units by mouth 2 (two) times daily.    [provider]  clindamycin (CLEOCIN) 300 MG capsule Take 1 capsule (300 mg total) by mouth 4 (four) times daily. X 7 days 05/18/15   Rolan Bucco, MD  cloNIDine (CATAPRES) 0.2 MG tablet  Take 0.2 mg by mouth 3 (three) times daily.      [provider]  ferrous sulfate 325 (65 FE) MG tablet Take 325 mg by mouth 2 (two) times daily.      [provider]  fluticasone (FLONASE) 50 MCG/ACT nasal spray Place 2 sprays into both nostrils daily. 09/14/16   Alvira Monday, MD  HYDROcodone-acetaminophen (NORCO/VICODIN) 5-325 MG tablet Take 1-2 tablets by mouth every 4 (four) hours as needed. 05/18/15   Rolan Bucco, MD  labetalol (NORMODYNE) 200 MG tablet Take 600 mg by mouth 2 (two) times daily.    [provider]  levETIRAcetam (KEPPRA) 1000 MG tablet Take 500 mg by mouth 2 (two) times daily.    [provider]  lisinopril (PRINIVIL,ZESTRIL) 10 MG tablet Take 10 mg by mouth daily.      [provider]  magnesium oxide (MAG-OX) 400 MG tablet Take 400 mg by mouth daily.    [provider]  mycophenolate (CELLCEPT) 250 MG capsule Take 1,000 mg by mouth 2 (two) times daily.      [provider]  prednisoLONE 5 MG TABS Take 5 mg by mouth daily.    [provider]  tacrolimus (PROGRAF) 1 MG capsule Take 4 mg by mouth 2 (two) times daily.    [provider]      Allergies    Lisinopril    Review of Systems  Review of Systems  Constitutional:  Positive for fatigue. Negative for activity change, appetite change and fever.  HENT:  Positive for congestion. Negative for sore throat.   Respiratory:  Positive for cough and shortness of breath.   Cardiovascular:  Negative for chest pain and leg swelling.  Gastrointestinal:  Negative for abdominal pain, nausea and vomiting.  Genitourinary:  Negative for dysuria and hematuria.  Musculoskeletal:  Positive for arthralgias and myalgias.  Skin:  Negative for rash.  Neurological:  Negative for dizziness, weakness and headaches.   all other systems are negative except as noted in the HPI and PMH.    Physical Exam Updated Vital Signs BP (!) 159/99 (BP Location: Right  Arm)   Pulse 95   Temp 99 F (37.2 C) (Oral)   Resp 20   Ht 5\' 7"  (1.702 m)   Wt 77.1 kg   SpO2 91%   BMI 26.63 kg/m  Physical Exam Vitals and nursing note reviewed.  Constitutional:      General: He is not in acute distress.    Appearance: He is well-developed.     Comments: Speaking in full sentences, no distress  HENT:     Head: Normocephalic and atraumatic.     Mouth/Throat:     Pharynx: No oropharyngeal exudate.  Eyes:     Conjunctiva/sclera: Conjunctivae normal.     Pupils: Pupils are equal, round, and reactive to light.  Neck:     Comments: No meningismus. Cardiovascular:     Rate and Rhythm: Normal rate and regular rhythm.     Heart sounds: Normal heart sounds. No murmur heard. Pulmonary:     Effort: Pulmonary effort is normal. No respiratory distress.     Breath sounds: Rhonchi present. No wheezing.  Abdominal:     Palpations: Abdomen is soft.     Tenderness: There is no abdominal tenderness. There is no guarding or rebound.  Musculoskeletal:        General: No tenderness. Normal range of motion.     Cervical back: Normal range of motion and neck supple.     Right lower leg: No edema.     Left lower leg: No edema.  Skin:    General: Skin is warm.  Neurological:     Mental Status: He is alert and oriented to person, place, and time.     Cranial Nerves: No cranial nerve deficit.     Motor: No abnormal muscle tone.     Coordination: Coordination normal.     Comments:  5/5 strength throughout. CN 2-12 intact.Equal grip strength.   Psychiatric:        Behavior: Behavior normal.     ED Results / Procedures / Treatments   Labs (all labs ordered are listed, but only abnormal results are displayed) Labs Reviewed  CBC WITH DIFFERENTIAL/PLATELET - Abnormal; Notable for the following components:      Result Value   MCV 70.8 (*)    MCH 24.5 (*)    RDW 15.6 (*)    All other components within normal limits  COMPREHENSIVE METABOLIC PANEL - Abnormal; Notable for  the following components:   Glucose, Bld 103 (*)    BUN 32 (*)    Creatinine, Ser 2.17 (*)    GFR, Estimated 38 (*)    All other components within normal limits  RESP PANEL BY RT-PCR (RSV, FLU A&B, COVID)  RVPGX2  CULTURE, BLOOD (ROUTINE X 2)  CULTURE, BLOOD (ROUTINE X 2)  LACTIC ACID, PLASMA  LACTIC ACID,  PLASMA  TROPONIN I (HIGH SENSITIVITY)  TROPONIN I (HIGH SENSITIVITY)    EKG EKG Interpretation Date/Time:  Thursday July 02 2023 23:42:06 EST Ventricular Rate:  84 PR Interval:  133 QRS Duration:  92 QT Interval:  357 QTC Calculation: 422 R Axis:   34  Text Interpretation: Sinus rhythm Consider right atrial enlargement Nonspecific T abnormalities, lateral leads ST elev, probable normal early repol pattern No significant change was found Confirmed by Glynn Octave [phone]) on 07/02/2023 11:52:31 PM  Radiology DG Chest 2 View  Result Date: 07/03/2023 CLINICAL DATA:  Cough. EXAM: CHEST - 2 VIEW COMPARISON:  September 14, 2016 FINDINGS: The heart size and mediastinal contours are within normal limits. Low lung volumes are noted with mild areas of atelectasis and/or infiltrate seen within the bilateral lung bases. There is a small, stable right pleural effusion versus pleural thickening. No pneumothorax is identified. The visualized skeletal structures are unremarkable. IMPRESSION: 1. Low lung volumes with mild bibasilar atelectasis and/or infiltrate. 2. Small right pleural effusion versus pleural thickening. Electronically Signed   By: Aram Candela M.D.   On: 07/03/2023 00:44    Procedures Procedures    Medications Ordered in ED Medications  albuterol (VENTOLIN HFA) 108 (90 Base) MCG/ACT inhaler 2 puff (has no administration in time range)    ED Course/ Medical Decision Making/ A&P                                 Medical Decision Making Amount and/or Complexity of Data Reviewed Labs: ordered. Decision-making details documented in ED Course. Radiology: ordered  and independent interpretation performed. Decision-making details documented in ED Course. ECG/medicine tests: ordered and independent interpretation performed. Decision-making details documented in ED Course.  Risk Prescription drug management. Decision regarding hospitalization.   2 weeks of cough, shortness of breath, fatigue, body aches.  History of kidney transplant remotely.  No chest pain.  No history of asthma or COPD.  COVID test are negative.  Patient's creatinine 2.17.  This is higher than what it was in our system in 2016.  Creatinine at [facility] shows 2.0 in June  Chest x-ray shows stable right sided pleural effusion as well as basilar atelectasis.  Results reviewed and interpreted by me.  Patient remains tachypneic with borderline O2 saturations and desaturates to the mid 80s with ambulation.  Denies chest pain.  Low suspicion for ACS or PE.  Will treat for suspected pneumonia with tachypnea and hypoxia with ambulation.  Given his immunocompromise state, blood cultures are sent.  Started on Rocephin and Zithromax for pneumonia.  Admission discussed with Dr. Margo Aye.       Final Clinical Impression(s) / ED Diagnoses Final diagnoses:  Community acquired pneumonia of right lung, unspecified part of lung  Acute respiratory failure with hypoxia [facility])    Rx / DC Orders ED Discharge Orders     None         Tamarah Bhullar, Jeannett Senior, MD 07/03/23 [phone]

## 2023-07-02 NOTE — ED Triage Notes (Addendum)
Pt reports productive cough of yellow mucus x 2 weeks associated with shortness of breath, chest congestion, body aches and loss of taste. Initial RA sats 88%-92% in triage.

## 2023-07-03 DIAGNOSIS — Z940 Kidney transplant status: Secondary | ICD-10-CM | POA: Diagnosis not present

## 2023-07-03 DIAGNOSIS — E663 Overweight: Secondary | ICD-10-CM | POA: Diagnosis present

## 2023-07-03 DIAGNOSIS — J9601 Acute respiratory failure with hypoxia: Secondary | ICD-10-CM

## 2023-07-03 DIAGNOSIS — Z6826 Body mass index (BMI) 26.0-26.9, adult: Secondary | ICD-10-CM | POA: Diagnosis not present

## 2023-07-03 DIAGNOSIS — F17200 Nicotine dependence, unspecified, uncomplicated: Secondary | ICD-10-CM | POA: Diagnosis not present

## 2023-07-03 DIAGNOSIS — Z888 Allergy status to other drugs, medicaments and biological substances status: Secondary | ICD-10-CM | POA: Diagnosis not present

## 2023-07-03 DIAGNOSIS — I10 Essential (primary) hypertension: Secondary | ICD-10-CM | POA: Diagnosis present

## 2023-07-03 DIAGNOSIS — Z79621 Long term (current) use of calcineurin inhibitor: Secondary | ICD-10-CM | POA: Diagnosis not present

## 2023-07-03 DIAGNOSIS — Z716 Tobacco abuse counseling: Secondary | ICD-10-CM | POA: Diagnosis not present

## 2023-07-03 DIAGNOSIS — Z79624 Long term (current) use of inhibitors of nucleotide synthesis: Secondary | ICD-10-CM | POA: Diagnosis not present

## 2023-07-03 DIAGNOSIS — D84821 Immunodeficiency due to drugs: Secondary | ICD-10-CM | POA: Diagnosis present

## 2023-07-03 DIAGNOSIS — G40909 Epilepsy, unspecified, not intractable, without status epilepticus: Secondary | ICD-10-CM | POA: Diagnosis present

## 2023-07-03 DIAGNOSIS — Z79899 Other long term (current) drug therapy: Secondary | ICD-10-CM | POA: Diagnosis not present

## 2023-07-03 DIAGNOSIS — J189 Pneumonia, unspecified organism: Secondary | ICD-10-CM | POA: Diagnosis present

## 2023-07-03 DIAGNOSIS — Z1152 Encounter for screening for COVID-19: Secondary | ICD-10-CM | POA: Diagnosis not present

## 2023-07-03 DIAGNOSIS — G4733 Obstructive sleep apnea (adult) (pediatric): Secondary | ICD-10-CM | POA: Diagnosis present

## 2023-07-03 DIAGNOSIS — F17210 Nicotine dependence, cigarettes, uncomplicated: Secondary | ICD-10-CM | POA: Diagnosis present

## 2023-07-03 LAB — BRAIN NATRIURETIC PEPTIDE: B Natriuretic Peptide: 20 pg/mL (ref 0.0–100.0)

## 2023-07-03 LAB — COMPREHENSIVE METABOLIC PANEL
ALT: 18 U/L (ref 0–44)
AST: 20 U/L (ref 15–41)
Albumin: 3.5 g/dL (ref 3.5–5.0)
Alkaline Phosphatase: 48 U/L (ref 38–126)
Anion gap: 9 (ref 5–15)
BUN: 32 mg/dL — ABNORMAL HIGH (ref 6–20)
CO2: 22 mmol/L (ref 22–32)
Calcium: 9 mg/dL (ref 8.9–10.3)
Chloride: 105 mmol/L (ref 98–111)
Creatinine, Ser: 2.17 mg/dL — ABNORMAL HIGH (ref 0.61–1.24)
GFR, Estimated: 38 mL/min — ABNORMAL LOW (ref >60)
Glucose, Bld: 103 mg/dL — ABNORMAL HIGH (ref 70–99)
Potassium: 3.9 mmol/L (ref 3.5–5.1)
Sodium: 136 mmol/L (ref 135–145)
Total Bilirubin: 0.5 mg/dL (ref <1.2)
Total Protein: 7.4 g/dL (ref 6.5–8.1)

## 2023-07-03 LAB — CBC WITH DIFFERENTIAL/PLATELET
Abs Immature Granulocytes: 0.04 10*3/uL (ref 0.00–0.07)
Basophils Absolute: 0.1 10*3/uL (ref 0.0–0.1)
Basophils Relative: 1 %
Eosinophils Absolute: 0.2 10*3/uL (ref 0.0–0.5)
Eosinophils Relative: 2 %
HCT: 39 % (ref 39.0–52.0)
Hemoglobin: 13.5 g/dL (ref 13.0–17.0)
Immature Granulocytes: 0 %
Lymphocytes Relative: 30 %
Lymphs Abs: 3.1 10*3/uL (ref 0.7–4.0)
MCH: 24.5 pg — ABNORMAL LOW (ref 26.0–34.0)
MCHC: 34.6 g/dL (ref 30.0–36.0)
MCV: 70.8 fL — ABNORMAL LOW (ref 80.0–100.0)
Monocytes Absolute: 0.9 10*3/uL (ref 0.1–1.0)
Monocytes Relative: 9 %
Neutro Abs: 6.1 10*3/uL (ref 1.7–7.7)
Neutrophils Relative %: 58 %
Platelets: 306 10*3/uL (ref 150–400)
RBC: 5.51 MIL/uL (ref 4.22–5.81)
RDW: 15.6 % — ABNORMAL HIGH (ref 11.5–15.5)
WBC: 10.4 10*3/uL (ref 4.0–10.5)
nRBC: 0 % (ref 0.0–0.2)

## 2023-07-03 LAB — VITAMIN D 25 HYDROXY (VIT D DEFICIENCY, FRACTURES): Vit D, 25-Hydroxy: 22.47 ng/mL — ABNORMAL LOW (ref 30–100)

## 2023-07-03 LAB — LACTIC ACID, PLASMA
Lactic Acid, Venous: 0.7 mmol/L (ref 0.5–1.9)
Lactic Acid, Venous: 0.7 mmol/L (ref 0.5–1.9)

## 2023-07-03 LAB — RESP PANEL BY RT-PCR (RSV, FLU A&B, COVID)  RVPGX2
Influenza A by PCR: NEGATIVE
Influenza B by PCR: NEGATIVE
Resp Syncytial Virus by PCR: NEGATIVE
SARS Coronavirus 2 by RT PCR: NEGATIVE

## 2023-07-03 LAB — PHOSPHORUS: Phosphorus: 3.9 mg/dL (ref 2.5–4.6)

## 2023-07-03 LAB — STREP PNEUMONIAE URINARY ANTIGEN: Strep Pneumo Urinary Antigen: NEGATIVE

## 2023-07-03 LAB — MAGNESIUM: Magnesium: 1.6 mg/dL — ABNORMAL LOW (ref 1.7–2.4)

## 2023-07-03 LAB — TROPONIN I (HIGH SENSITIVITY)
Troponin I (High Sensitivity): 11 ng/L (ref <18)
Troponin I (High Sensitivity): 11 ng/L (ref <18)

## 2023-07-03 LAB — HIV ANTIBODY (ROUTINE TESTING W REFLEX): HIV Screen 4th Generation wRfx: NONREACTIVE

## 2023-07-03 MED ORDER — ONDANSETRON HCL 4 MG/2ML IJ SOLN: 4.0000 mg | Freq: Four times a day (QID) | INTRAMUSCULAR | Status: DC | PRN

## 2023-07-03 MED ORDER — TACROLIMUS 1 MG PO CAPS
6.0000 mg | ORAL_CAPSULE | Freq: Two times a day (BID) | ORAL | Status: DC
  Administered 2023-07-03 – 2023-07-05 (×5): 6 mg via ORAL
  Filled 2023-07-03 (×6): qty 6

## 2023-07-03 MED ORDER — GUAIFENESIN 100 MG/5ML PO LIQD
10.0000 mL | Freq: Once | ORAL | Status: AC
  Administered 2023-07-03: 10 mL via ORAL
  Filled 2023-07-03: qty 10

## 2023-07-03 MED ORDER — AMLODIPINE BESYLATE 10 MG PO TABS
10.0000 mg | ORAL_TABLET | Freq: Every day | ORAL | Status: DC
  Administered 2023-07-03 – 2023-07-05 (×3): 10 mg via ORAL
  Filled 2023-07-03 (×3): qty 1

## 2023-07-03 MED ORDER — MAGNESIUM OXIDE -MG SUPPLEMENT 400 (240 MG) MG PO TABS
400.0000 mg | ORAL_TABLET | Freq: Every day | ORAL | Status: DC
  Administered 2023-07-03 – 2023-07-05 (×3): 400 mg via ORAL
  Filled 2023-07-03 (×3): qty 1

## 2023-07-03 MED ORDER — PREDNISOLONE 5 MG PO TABS
5.0000 mg | ORAL_TABLET | Freq: Every day | ORAL | Status: DC
  Administered 2023-07-03 – 2023-07-04 (×2): 5 mg via ORAL
  Filled 2023-07-03 (×2): qty 1

## 2023-07-03 MED ORDER — IPRATROPIUM-ALBUTEROL 0.5-2.5 (3) MG/3ML IN SOLN: 3.0000 mL | Freq: Four times a day (QID) | RESPIRATORY_TRACT | Status: DC | PRN

## 2023-07-03 MED ORDER — SENNOSIDES-DOCUSATE SODIUM 8.6-50 MG PO TABS: 1.0000 | ORAL_TABLET | Freq: Every evening | ORAL | Status: DC | PRN

## 2023-07-03 MED ORDER — DM-GUAIFENESIN ER 30-600 MG PO TB12
1.0000 | ORAL_TABLET | Freq: Two times a day (BID) | ORAL | Status: DC
  Administered 2023-07-03 – 2023-07-05 (×4): 1 via ORAL
  Filled 2023-07-03: qty 1
  Filled 2023-07-03: qty 2
  Filled 2023-07-03 (×2): qty 1

## 2023-07-03 MED ORDER — ENOXAPARIN SODIUM 40 MG/0.4ML IJ SOSY
40.0000 mg | PREFILLED_SYRINGE | INTRAMUSCULAR | Status: DC
  Administered 2023-07-03 – 2023-07-05 (×3): 40 mg via SUBCUTANEOUS
  Filled 2023-07-03 (×3): qty 0.4

## 2023-07-03 MED ORDER — VITAMIN D 25 MCG (1000 UNIT) PO TABS
1000.0000 [IU] | ORAL_TABLET | Freq: Two times a day (BID) | ORAL | Status: DC
  Administered 2023-07-03 – 2023-07-05 (×5): 1000 [IU] via ORAL
  Filled 2023-07-03 (×5): qty 1

## 2023-07-03 MED ORDER — MYCOPHENOLATE MOFETIL 250 MG PO CAPS
1000.0000 mg | ORAL_CAPSULE | Freq: Two times a day (BID) | ORAL | Status: DC
  Administered 2023-07-03 – 2023-07-05 (×5): 1000 mg via ORAL
  Filled 2023-07-03 (×7): qty 4

## 2023-07-03 MED ORDER — CEFTRIAXONE SODIUM 1 G IJ SOLR
1.0000 g | INTRAMUSCULAR | Status: DC
  Administered 2023-07-04: 1 g via INTRAMUSCULAR
  Filled 2023-07-03: qty 10

## 2023-07-03 MED ORDER — TACROLIMUS 0.5 MG PO CAPS
0.5000 mg | ORAL_CAPSULE | Freq: Two times a day (BID) | ORAL | Status: DC
Start: 2023-07-03 — End: 2023-07-05
  Administered 2023-07-03 – 2023-07-05 (×5): 0.5 mg via ORAL
  Filled 2023-07-03 (×6): qty 1

## 2023-07-03 MED ORDER — DEXTROSE 5 % IV SOLN
500.0000 mg | Freq: Once | INTRAVENOUS | Status: AC
  Administered 2023-07-03: 500 mg via INTRAVENOUS
  Filled 2023-07-03 (×2): qty 5

## 2023-07-03 MED ORDER — MAGNESIUM SULFATE 2 GM/50ML IV SOLN
2.0000 g | Freq: Once | INTRAVENOUS | Status: AC
  Administered 2023-07-03: 2 g via INTRAVENOUS
  Filled 2023-07-03: qty 50

## 2023-07-03 MED ORDER — AZITHROMYCIN 500 MG PO TABS
500.0000 mg | ORAL_TABLET | Freq: Every day | ORAL | Status: DC
  Administered 2023-07-04 – 2023-07-05 (×2): 500 mg via ORAL
  Filled 2023-07-03 (×2): qty 1

## 2023-07-03 MED ORDER — BENZONATATE 100 MG PO CAPS
100.0000 mg | ORAL_CAPSULE | Freq: Once | ORAL | Status: AC
  Administered 2023-07-03: 100 mg via ORAL
  Filled 2023-07-03: qty 1

## 2023-07-03 MED ORDER — LOSARTAN POTASSIUM 50 MG PO TABS
100.0000 mg | ORAL_TABLET | Freq: Every day | ORAL | Status: DC
  Administered 2023-07-03 – 2023-07-05 (×3): 100 mg via ORAL
  Filled 2023-07-03 (×3): qty 2

## 2023-07-03 MED ORDER — LABETALOL HCL 300 MG PO TABS
1200.0000 mg | ORAL_TABLET | Freq: Two times a day (BID) | ORAL | Status: DC
  Administered 2023-07-03 – 2023-07-05 (×5): 1200 mg via ORAL
  Filled 2023-07-03 (×5): qty 4
  Filled 2023-07-03: qty 6

## 2023-07-03 MED ORDER — ACETAMINOPHEN 650 MG RE SUPP: 650.0000 mg | Freq: Four times a day (QID) | RECTAL | Status: DC | PRN

## 2023-07-03 MED ORDER — SODIUM CHLORIDE 0.9 % IV SOLN
1.0000 g | Freq: Once | INTRAVENOUS | Status: AC
  Administered 2023-07-03: 1 g via INTRAVENOUS
  Filled 2023-07-03: qty 10

## 2023-07-03 MED ORDER — ACETAMINOPHEN 325 MG PO TABS
650.0000 mg | ORAL_TABLET | Freq: Four times a day (QID) | ORAL | Status: DC | PRN
Start: 2023-07-03 — End: 2023-07-05

## 2023-07-03 MED ORDER — ONDANSETRON HCL 4 MG PO TABS: 4.0000 mg | ORAL_TABLET | Freq: Four times a day (QID) | ORAL | Status: DC | PRN

## 2023-07-03 NOTE — ED Notes (Signed)
Will be after 7a ( shift change when truck will be en route to get patient). No ETA

## 2023-07-03 NOTE — Plan of Care (Signed)

## 2023-07-03 NOTE — H&P (Signed)
History and Physical    Patient: Marcus Greene:[id number] DOB: September 01, 1980 DOA: 07/02/2023 DOS: the patient was seen and examined on 07/03/2023 PCP: Marcus Civil, MD  Patient coming from: [facility]  Chief Complaint:  Chief Complaint  Patient presents with   Cough   HPI: Marcus Greene is a 42 y.o. male with medical history significant of HTN, kidney transplant in 2010 on immunosuppression, and seizure disorder who presented to the [facility], ED for evaluation of cough and shortness of breath. Patient was in his usual state of health until 2 weeks ago he started having cough and cold symptoms. He reports having productive cough with yellow sputum with associated chest congestion, body aches, chills and loss of taste. He tried OTC cough drops and Mucinex without significant improvement.  He had associated shortness of breath that has progressed over the last 2 weeks. He endorsed some chest tightness when coughing but denies chest pain, headaches, dizziness, vision changes, nausea, vomiting, dysuria, leg swelling or orthopnea. Per spouse, patient snores at night and occasionally has apneic episodes while sleeping. Patient endorses not getting enough sleep and feeling fatigue during the day.  ED course: Found to be tachypneic with RR 24-31, hypertensive with SBP in the 130s-160s, hypoxic with SpO2 88-92% on room air, improved to 95% on 2 L [place]. Labs showed WBC 10.4, Hgb 13.5, K+ 3.9, BUN/creatinine 32/2.17, lactic acid 0.7-0.7, troponin 11-11, negative flu and COVID test. CXR shows mild bibasilar atelectasis and/or infiltrate with small right pleural effusion versus pleural thickening. Received IV Rocephin and azithromycin, albuterol inhaler, guaifenesin and benzonatate Admitted to [facility] service and transferred to [facility]  Review of Systems: As mentioned in the history of present illness. All other systems reviewed and are negative. Past Medical History:  Diagnosis  Date   Hypertension    Kidney failure    Seizures (HCC)    last seizure 01/2013   Thrush    Past Surgical History:  Procedure Laterality Date   KIDNEY TRANSPLANT     Social History:  reports that he has been smoking cigarettes. He has never used smokeless tobacco. He reports current drug use. Drug: Marijuana. He reports that he does not drink alcohol.  Allergies  Allergen Reactions   Lisinopril Swelling    angioedema    History reviewed. No pertinent family history.  Prior to Admission medications   Medication Sig Start Date End Date Taking? Authorizing Provider  amLODipine (NORVASC) 10 MG tablet Take 10 mg by mouth daily.      [provider]  benzonatate (TESSALON) 100 MG capsule Take 1 capsule (100 mg total) by mouth every 8 (eight) hours. 09/14/16   Marcus Monday, MD  carbamazepine (TEGRETOL XR) 200 MG 12 hr tablet Take 200 mg by mouth 2 (two) times daily.    [provider]  cholecalciferol (VITAMIN D) 1000 UNITS tablet Take 1,000 Units by mouth 2 (two) times daily.    [provider]  clindamycin (CLEOCIN) 300 MG capsule Take 1 capsule (300 mg total) by mouth 4 (four) times daily. X 7 days 05/18/15   Marcus Bucco, MD  cloNIDine (CATAPRES) 0.2 MG tablet Take 0.2 mg by mouth 3 (three) times daily.      [provider]  ferrous sulfate 325 (65 FE) MG tablet Take 325 mg by mouth 2 (two) times daily.      [provider]  fluticasone (FLONASE) 50 MCG/ACT nasal spray Place 2 sprays into both nostrils daily. 09/14/16   Greene,  Marcus Peon, MD  HYDROcodone-acetaminophen (NORCO/VICODIN) 5-325 MG tablet Take 1-2 tablets by mouth every 4 (four) hours as needed. 05/18/15   Marcus Bucco, MD  labetalol (NORMODYNE) 200 MG tablet Take 600 mg by mouth 2 (two) times daily.    [provider]  levETIRAcetam (KEPPRA) 1000 MG tablet Take 500 mg by mouth 2 (two) times daily.    [provider]  lisinopril (PRINIVIL,ZESTRIL) 10 MG tablet  Take 10 mg by mouth daily.      [provider]  magnesium oxide (MAG-OX) 400 MG tablet Take 400 mg by mouth daily.    [provider]  mycophenolate (CELLCEPT) 250 MG capsule Take 1,000 mg by mouth 2 (two) times daily.      [provider]  prednisoLONE 5 MG TABS Take 5 mg by mouth daily.    [provider]  tacrolimus (PROGRAF) 1 MG capsule Take 4 mg by mouth 2 (two) times daily.    [provider]    Physical Exam: Vitals:   07/03/23 0645 07/03/23 0700 07/03/23 0856 07/03/23 0857  BP: (!) 158/112 (!) 158/117 (!) 150/112   Pulse: 88 92 89 86  Resp: (!) 23 20  20   Temp:   98.2 F (36.8 C)   TempSrc:   Oral   SpO2: 93% 96% 98% 99%  Weight:      Height:       General: Pleasant, well-appearing middle-age man laying in bed. No acute distress. HEENT: [place]/AT. Anicteric sclera CV: RRR. No murmurs, rubs, or gallops. No LE edema Pulmonary: On 1.5 L [place].  Lungs CTAB.  Normal effort. Coarse crackles at the bases. Mild expiratory wheezes. Abdominal: Soft, nontender, nondistended. Normal bowel sounds. Extremities: Palpable radial and DP pulses. Normal ROM. Skin: Warm and dry. No obvious rash or lesions. Neuro: A&Ox3. Moves all extremities. Normal sensation to light touch. No focal deficit. Psych: Normal mood and affect  Data Reviewed:  WBC 10.4, Hgb 13.5, K+ 3.9, BUN/creatinine 32/2.17, lactic acid 0.7-0.7, troponin 11-11, negative flu and COVID test. CXR shows mild bibasilar atelectasis and/or infiltrate with small right pleural effusion versus pleural thickening.  Assessment and Plan: Marcus Greene is a 42 y.o. male with medical history significant of HTN, tobacco use disorder, kidney transplant in 2010 on immunosuppression, vitamin D deficiency, and seizure disorder who presented to [facility] for Evaluation of Cough and SOB and admitted at [facility] for community-acquired pneumonia.  # Community-acquired pneumonia Kidney transplant  patient with 2 weeks of cold and cough symptoms, sputum production and shortness of breath found to have possible infiltrate on CXR.  Patient is afebrile and without leukocytosis however he is on immunosuppressive medications and has new O2 requirement. COVID and flu negative. Will continue IV antibiotics and monitor respiratory status closely. -Continue IV Rocephin, change azithromycin to oral -Mucinex DM twice daily -Continue on supplemental O2 -Check sputum culture, RVP, strep and Legionella antigens -Follow-up blood culture -Trend CBC, fever curve  # Acute hypoxic respiratory failure Patient with history of tobacco use disorder and kidney transplant found to to be hypoxic with SpO2 in the high 80s requiring supplemental oxygen to maintain O2 above 92%.  He is in no respiratory distress. CXR shows possible bibasilar infiltrates/atelectasis. No signs of heart failure exacerbation on exam.  He does have coarse crackles as well as mild expiratory wheezes on lung auscultation. Currently on 1.5 L [place]. Hypoxia likely secondary to CAP and possible underlying lung disease. -Continue supplemental O2 -Check BNP -Pulse ox with ambulation tomorrow -  Telemetry  # Kidney transplant History of kidney transplant back in 2010. Current medication includes mycophenolate, tacrolimus and prednisone.  Creatinine 2.17, around his baseline of 2. Creatinine of 2.01 4 months ago. Follows with nephrology at [facility]. -Avoid nephrotoxic agents -Courage oral hydration -Tacrolimus 6.5 mg twice daily -Mycophenolate 1000 mg twice daily -Prednisone on 5 mg daily -Check tacrolimus levels  # HTN BP elevated with SBP in the 140s to 150s. -Resume home amlodipine 10 mg daily, labetalol 1200 mg twice daily and losartan 100 mg daily  #Hx vitamin D deficiency Last vitamin D 19.0 4 months ago. -Resume home vitamin D3 1000 units twice daily -F/u Vitamin D levels  #Hx hypomagnesemia Magnesium 1.6 4 months ago -Resume home  magnesium oxide 400 mg daily -F/u magnesium levels  # Possible OSA Per spouse, patient snores at night and occasionally has apneic episodes while sleeping. Patient reports not getting enough sleep and often feeling fatigue during the day.  He reports a sleep study that was normal many years ago.  He has a STOP-BANG score of 5 placing him at a HIGH risk of moderate to severe OSA. Patient agreeable to trying CPAP here in the hospital. -CPAP at night -Sleep study in the outpatient  # Tobacco use disorder Patient reports smoking about 1 pack/day of cigarettes. He has been smoking since the age of 51 and has been smoking half a pack to a pack a day since then.  He is interested in quitting and has attempted in the past without success but also without any nicotine replacement.  He is not interested in nicotine patch during hospitalization but interested in quitting long term. Discussed with patient different nicotine replacement therapies to assist with quitting. -Follow-up with PCP to discuss NRT to help with tobacco cessation  Smoking cessation counseling for 4 minutes today, consider nicotine patch  I have discussed tobacco cessation with the patient.  I have counseled the patient regarding the negative impacts of continued tobacco use including but not limited to lung cancer, COPD, and cardiovascular disease.  I have discussed alternatives to tobacco and modalities that may help facilitate tobacco cessation including but not limited to biofeedback, hypnosis, and medications.  Total time spent with tobacco counseling was 4 minutes.   Advance Care Planning:   Code Status: Not on file   Consults: None  Family Communication: Discussed admission with spouse at bedside  Severity of Illness: The appropriate patient status for this patient is OBSERVATION. Observation status is judged to be reasonable and necessary in order to provide the required intensity of service to ensure the patient's safety. The  patient's presenting symptoms, physical exam findings, and initial radiographic and laboratory data in the context of their medical condition is felt to place them at decreased risk for further clinical deterioration. Furthermore, it is anticipated that the patient will be medically stable for discharge from the hospital within 2 midnights of admission.   Author: Steffanie Rainwater, MD 07/03/2023 9:21 AM  For on call review [email or web address].

## 2023-07-03 NOTE — ED Notes (Signed)
Pt ambulated from room 5 around the nurses station and back to his room. O2 stats remain at 91 and 92% heart rate was at 105. RR is 27 pt denies any pain but complains of still being sob

## 2023-07-03 NOTE — ED Notes (Signed)
[facility] called for transport , No Current ETA... ED Nurse will call floor for report  Called @ 05:38 am

## 2023-07-03 NOTE — ED Notes (Signed)
ED TO INPATIENT HANDOFF REPORT  ED Nurse Name and Phone #: Gaspar Garbe [phone]  S Name/Age/Gender Marcus Greene 42 y.o. male Room/Bed: MH05/MH05  Code Status   Code Status: Not on file  Home/SNF/Other Home Patient oriented to: self, place, time, and situation Is this baseline? Yes   Triage Complete: Triage complete  Chief Complaint CAP (community acquired pneumonia) [J18.9]  Triage Note Pt reports productive cough of yellow mucus x 2 weeks associated with shortness of breath, chest congestion, body aches and loss of taste. Initial RA sats 88%-92% in triage.    Allergies Allergies  Allergen Reactions   Lisinopril Swelling    angioedema    Level of Care/Admitting Diagnosis ED Disposition     ED Disposition  Admit   Condition  --   Comment  Hospital Area: [facility] [[id number]]  Level of Care: Telemetry [5]  Admit to tele based on following criteria: Monitor for Ischemic changes  May admit patient to [facility] or [facility] if equivalent level of care is available:: Yes  Interfacility transfer: Yes  Covid Evaluation: Asymptomatic - no recent exposure (last 10 days) testing not required  Diagnosis: CAP (community acquired pneumonia) [213086]  Admitting Physician: Darlin Drop [[id number]]  Attending Physician: Darlin Drop [[id number]]  Certification:: I certify this patient will need inpatient services for at least 2 midnights  Expected Medical Readiness: 07/05/2023          B Medical/Surgery History Past Medical History:  Diagnosis Date   Hypertension    Kidney failure    Seizures (HCC)    last seizure 01/2013   Thrush    Past Surgical History:  Procedure Laterality Date   KIDNEY TRANSPLANT       A IV Location/Drains/Wounds Patient Lines/Drains/Airways Status     Active Line/Drains/Airways     Name Placement date Placement time Site Days   Peripheral IV 07/03/23 20 G Right;Lateral Antecubital 07/03/23  0023   Antecubital  less than 1            Intake/Output Last 24 hours  Intake/Output Summary (Last 24 hours) at 07/03/2023 0735 Last data filed at 07/03/2023 0421 Gross per 24 hour  Intake 347.48 ml  Output --  Net 347.48 ml    Labs/Imaging Results for orders placed or performed during the hospital encounter of 07/02/23 (from the past 48 hour(s))  Resp panel by RT-PCR (RSV, Flu A&B, Covid) Anterior Nasal Swab     Status: None   Collection Time: 07/02/23 11:33 PM   Specimen: Anterior Nasal Swab  Result Value Ref Range   SARS Coronavirus 2 by RT PCR NEGATIVE NEGATIVE    Comment: (NOTE) SARS-CoV-2 target nucleic acids are NOT DETECTED.  The SARS-CoV-2 RNA is generally detectable in upper respiratory specimens during the acute phase of infection. The lowest concentration of SARS-CoV-2 viral copies this assay can detect is 138 copies/mL. A negative result does not preclude SARS-Cov-2 infection and should not be used as the sole basis for treatment or other patient management decisions. A negative result may occur with  improper specimen collection/handling, submission of specimen other than nasopharyngeal swab, presence of viral mutation(s) within the areas targeted by this assay, and inadequate number of viral copies(<138 copies/mL). A negative result must be combined with clinical observations, patient history, and epidemiological information. The expected result is Negative.  Fact Sheet for Patients:  BloggerCourse.com  Fact Sheet for Healthcare Providers:  SeriousBroker.it  This test is no t yet approved or cleared by  the [employer] and  has been authorized for detection and/or diagnosis of SARS-CoV-2 by FDA under an Emergency Use Authorization (EUA). This EUA will remain  in effect (meaning this test can be used) for the duration of the COVID-19 declaration under Section 564(b)(1) of the Act, 21 U.S.C.section  360bbb-3(b)(1), unless the authorization is terminated  or revoked sooner.       Influenza A by PCR NEGATIVE NEGATIVE   Influenza B by PCR NEGATIVE NEGATIVE    Comment: (NOTE) The Xpert Xpress SARS-CoV-2/FLU/RSV plus assay is intended as an aid in the diagnosis of influenza from Nasopharyngeal swab specimens and should not be used as a sole basis for treatment. Nasal washings and aspirates are unacceptable for Xpert Xpress SARS-CoV-2/FLU/RSV testing.  Fact Sheet for Patients: BloggerCourse.com  Fact Sheet for Healthcare Providers: SeriousBroker.it  This test is not yet approved or cleared by the [place] FDA and has been authorized for detection and/or diagnosis of SARS-CoV-2 by FDA under an Emergency Use Authorization (EUA). This EUA will remain in effect (meaning this test can be used) for the duration of the COVID-19 declaration under Section 564(b)(1) of the Act, 21 U.S.C. section 360bbb-3(b)(1), unless the authorization is terminated or revoked.     Resp Syncytial Virus by PCR NEGATIVE NEGATIVE    Comment: (NOTE) Fact Sheet for Patients: BloggerCourse.com  Fact Sheet for Healthcare Providers: SeriousBroker.it  This test is not yet approved or cleared by the [place] FDA and has been authorized for detection and/or diagnosis of SARS-CoV-2 by FDA under an Emergency Use Authorization (EUA). This EUA will remain in effect (meaning this test can be used) for the duration of the COVID-19 declaration under Section 564(b)(1) of the Act, 21 U.S.C. section 360bbb-3(b)(1), unless the authorization is terminated or revoked.  Performed at [facility], [address] [place], [place] [address]   Lactic acid, plasma     Status: None   Collection Time: 07/02/23 11:52 PM  Result Value Ref Range   Lactic Acid, Venous 0.7 0.5 - 1.9 mmol/L    Comment: Performed  at [facility], [address] [place], [place] [address]  CBC with Differential     Status: Abnormal   Collection Time: 07/02/23 11:52 PM  Result Value Ref Range   WBC 10.4 4.0 - 10.5 K/uL   RBC 5.51 4.22 - 5.81 MIL/uL   Hemoglobin 13.5 13.0 - 17.0 g/dL   HCT 40.1 02.7 - 25.3 %   MCV 70.8 (L) 80.0 - 100.0 fL   MCH 24.5 (L) 26.0 - 34.0 pg   MCHC 34.6 30.0 - 36.0 g/dL    Comment: REPEATED TO VERIFY CORRECTED FOR COLD AGGLUTININS    RDW 15.6 (H) 11.5 - 15.5 %   Platelets 306 150 - 400 K/uL   nRBC 0.0 0.0 - 0.2 %   Neutrophils Relative % 58 %   Neutro Abs 6.1 1.7 - 7.7 K/uL   Lymphocytes Relative 30 %   Lymphs Abs 3.1 0.7 - 4.0 K/uL   Monocytes Relative 9 %   Monocytes Absolute 0.9 0.1 - 1.0 K/uL   Eosinophils Relative 2 %   Eosinophils Absolute 0.2 0.0 - 0.5 K/uL   Basophils Relative 1 %   Basophils Absolute 0.1 0.0 - 0.1 K/uL   Immature Granulocytes 0 %   Abs Immature Granulocytes 0.04 0.00 - 0.07 K/uL    Comment: Performed at [facility], [address] [place], [place] [address]  Comprehensive metabolic panel     Status: Abnormal   Collection Time: 07/02/23 11:52 PM  Result Value Ref Range   Sodium 136 135 - 145 mmol/L   Potassium 3.9 3.5 - 5.1 mmol/L   Chloride 105 98 - 111 mmol/L   CO2 22 22 - 32 mmol/L   Glucose, Bld 103 (H) 70 - 99 mg/dL    Comment: Glucose reference range applies only to samples taken after fasting for at least 8 hours.   BUN 32 (H) 6 - 20 mg/dL   Creatinine, Ser 4.40 (H) 0.61 - 1.24 mg/dL   Calcium 9.0 8.9 - 10.2 mg/dL   Total Protein 7.4 6.5 - 8.1 g/dL   Albumin 3.5 3.5 - 5.0 g/dL   AST 20 15 - 41 U/L   ALT 18 0 - 44 U/L   Alkaline Phosphatase 48 38 - 126 U/L   Total Bilirubin 0.5 <1.2 mg/dL   GFR, Estimated 38 (L) >60 mL/min    Comment: (NOTE) Calculated using the CKD-EPI Creatinine Equation (2021)    Anion gap 9 5 - 15    Comment: Performed at [facility], [address] [place], [place] [address]   Troponin I (High Sensitivity)     Status: None   Collection Time: 07/02/23 11:52 PM  Result Value Ref Range   Troponin I (High Sensitivity) 11 <18 ng/L    Comment: (NOTE) Elevated high sensitivity troponin I (hsTnI) values and significant  changes across serial measurements may suggest ACS but many other  chronic and acute conditions are known to elevate hsTnI results.  Refer to the "Links" section for chest pain algorithms and additional  guidance. Performed at [facility], [address] [place], [place] [address]   Lactic acid, plasma     Status: None   Collection Time: 07/03/23  1:49 AM  Result Value Ref Range   Lactic Acid, Venous 0.7 0.5 - 1.9 mmol/L    Comment: Performed at [facility], [address] [place], [place] [address]  Troponin I (High Sensitivity)     Status: None   Collection Time: 07/03/23  1:49 AM  Result Value Ref Range   Troponin I (High Sensitivity) 11 <18 ng/L    Comment: (NOTE) Elevated high sensitivity troponin I (hsTnI) values and significant  changes across serial measurements may suggest ACS but many other  chronic and acute conditions are known to elevate hsTnI results.  Refer to the "Links" section for chest pain algorithms and additional  guidance. Performed at [facility], [address] [place], [place] [address]    DG Chest 2 View  Result Date: 07/03/2023 CLINICAL DATA:  Cough. EXAM: CHEST - 2 VIEW COMPARISON:  September 14, 2016 FINDINGS: The heart size and mediastinal contours are within normal limits. Low lung volumes are noted with mild areas of atelectasis and/or infiltrate seen within the bilateral lung bases. There is a small, stable right pleural effusion versus pleural thickening. No pneumothorax is identified. The visualized skeletal structures are unremarkable. IMPRESSION: 1. Low lung volumes with mild bibasilar atelectasis and/or infiltrate. 2. Small right pleural effusion versus pleural  thickening. Electronically Signed   By: Aram Candela M.D.   On: 07/03/2023 00:44    Pending Labs Unresulted Labs (From admission, onward)     Start     Ordered   07/02/23 2349  Blood culture (routine x 2)  BLOOD CULTURE X 2,   STAT  07/02/23 2348            Vitals/Pain Today's Vitals   07/03/23 0522 07/03/23 0530 07/03/23 0645 07/03/23 0700  BP:  (!) 145/103 (!) 158/112 (!) 158/117  Pulse:  91 88 92  Resp:  (!) 25 (!) 23 20  Temp: 98.2 F (36.8 C)     TempSrc: Oral     SpO2: 93% 91% 93% 96%  Weight:      Height:      PainSc: 0-No pain       Isolation Precautions No active isolations  Medications Medications  albuterol (VENTOLIN HFA) 108 (90 Base) MCG/ACT inhaler 2 puff (2 puffs Inhalation Given 07/03/23 0002)  cefTRIAXone (ROCEPHIN) 1 g in sodium chloride 0.9 % 100 mL IVPB (0 g Intravenous Stopped 07/03/23 0300)  azithromycin (ZITHROMAX) 500 mg in dextrose 5 % 250 mL IVPB (0 mg Intravenous Stopped 07/03/23 0421)  benzonatate (TESSALON) capsule 100 mg (100 mg Oral Given 07/03/23 0556)  guaiFENesin (ROBITUSSIN) 100 MG/5ML liquid 10 mL (10 mLs Oral Given 07/03/23 0556)    Mobility walks     Focused Assessments Pulmonary Assessment Handoff:  Lung sounds: Bilateral Breath Sounds: Coarse crackles O2 Device: Nasal Cannula O2 Flow Rate (L/min): 2 L/min    R Recommendations: See Admitting Provider Note  Report given to:   Additional Notes: pt sleeping, has Sleep apnea but refused Cpap, family at bed side.

## 2023-07-04 DIAGNOSIS — J9601 Acute respiratory failure with hypoxia: Secondary | ICD-10-CM | POA: Diagnosis not present

## 2023-07-04 DIAGNOSIS — J189 Pneumonia, unspecified organism: Secondary | ICD-10-CM | POA: Diagnosis not present

## 2023-07-04 LAB — EXPECTORATED SPUTUM ASSESSMENT W GRAM STAIN, RFLX TO RESP C

## 2023-07-04 LAB — BLOOD CULTURE ID PANEL (REFLEXED) - BCID2

## 2023-07-04 LAB — BASIC METABOLIC PANEL
Anion gap: 9 (ref 5–15)
BUN: 28 mg/dL — ABNORMAL HIGH (ref 6–20)
CO2: 23 mmol/L (ref 22–32)
Calcium: 9.1 mg/dL (ref 8.9–10.3)
Chloride: 107 mmol/L (ref 98–111)
Creatinine, Ser: 2.38 mg/dL — ABNORMAL HIGH (ref 0.61–1.24)
GFR, Estimated: 34 mL/min — ABNORMAL LOW (ref >60)
Glucose, Bld: 99 mg/dL (ref 70–99)
Potassium: 4.3 mmol/L (ref 3.5–5.1)
Sodium: 139 mmol/L (ref 135–145)

## 2023-07-04 LAB — CBC
HCT: 37.5 % — ABNORMAL LOW (ref 39.0–52.0)
Hemoglobin: 12.7 g/dL — ABNORMAL LOW (ref 13.0–17.0)
MCH: 24.5 pg — ABNORMAL LOW (ref 26.0–34.0)
MCHC: 33.9 g/dL (ref 30.0–36.0)
MCV: 72.3 fL — ABNORMAL LOW (ref 80.0–100.0)
Platelets: 271 10*3/uL (ref 150–400)
RBC: 5.19 MIL/uL (ref 4.22–5.81)
RDW: 15.8 % — ABNORMAL HIGH (ref 11.5–15.5)
WBC: 10.7 10*3/uL — ABNORMAL HIGH (ref 4.0–10.5)
nRBC: 0 % (ref 0.0–0.2)

## 2023-07-04 LAB — MAGNESIUM: Magnesium: 2.3 mg/dL (ref 1.7–2.4)

## 2023-07-04 MED ORDER — PREDNISONE 20 MG PO TABS
40.0000 mg | ORAL_TABLET | Freq: Every day | ORAL | Status: DC
  Administered 2023-07-05: 40 mg via ORAL
  Filled 2023-07-04: qty 2

## 2023-07-04 MED ORDER — SODIUM CHLORIDE 0.9 % IV SOLN
1.0000 g | INTRAVENOUS | Status: DC
  Administered 2023-07-04 – 2023-07-05 (×2): 1 g via INTRAVENOUS
  Filled 2023-07-04 (×2): qty 10

## 2023-07-04 MED ORDER — IPRATROPIUM-ALBUTEROL 0.5-2.5 (3) MG/3ML IN SOLN
3.0000 mL | Freq: Four times a day (QID) | RESPIRATORY_TRACT | Status: DC
Start: 2023-07-04 — End: 2023-07-05
  Administered 2023-07-04 – 2023-07-05 (×6): 3 mL via RESPIRATORY_TRACT
  Filled 2023-07-04 (×4): qty 3

## 2023-07-04 MED ORDER — IPRATROPIUM-ALBUTEROL 0.5-2.5 (3) MG/3ML IN SOLN
RESPIRATORY_TRACT | Status: AC
  Filled 2023-07-04: qty 3

## 2023-07-04 MED ORDER — STERILE WATER FOR INJECTION IJ SOLN
INTRAMUSCULAR | Status: AC
  Administered 2023-07-04: 2.1 mL
  Filled 2023-07-04: qty 10

## 2023-07-04 NOTE — Progress Notes (Signed)
   07/03/23 2219  BiPAP/CPAP/SIPAP  Reason BIPAP/CPAP not in use Non-compliant (pt refused cpap)

## 2023-07-04 NOTE — Progress Notes (Signed)
SATURATION QUALIFICATIONS: (This note is used to comply with regulatory documentation for home oxygen)  Patient Saturations on Room Air at Rest = 97%  Patient Saturations on Room Air while Ambulating = 97%  Patient Saturations on 0 Liters of oxygen while Ambulating = 96-97%  Please briefly explain why patient needs home oxygen: Patient passed his walk test, does not need home oxygen.

## 2023-07-04 NOTE — Progress Notes (Signed)
RT note. Patient placed on auto cpap at this time per order. Patient on 18/6 auto cpap. Patient sat 96% with no labored breathing. RT will continue to monitor.    07/04/23 1336  BiPAP/CPAP/SIPAP  $ Non-Invasive Home Ventilator  Initial  $ Face Mask Medium Yes  BiPAP/CPAP/SIPAP Pt Type Adult  BiPAP/CPAP/SIPAP DREAMSTATIOND  Mask Type Full face mask  Mask Size Medium  Respiratory Rate 20 breaths/min  EPAP  (auto 18/6)  FiO2 (%) 21 %  Patient Home Equipment No  Auto Titrate Yes  BiPAP/CPAP /SiPAP Vitals  Pulse Rate 73  Resp 20  SpO2 96 %  Bilateral Breath Sounds Expiratory wheezes  MEWS Score/Color  MEWS Score 0  MEWS Score Color [place] Si

## 2023-07-04 NOTE — Progress Notes (Signed)
PROGRESS NOTE    Marcus Greene  EXB:[id number] DOB: [birth year] DOA: 07/02/2023 PCP: Carole Civil, MD    Brief Narrative:  Marcus Greene is a 42 y.o. male with medical history significant of HTN, kidney transplant in 2010 on immunosuppression, and seizure disorder who presented to the [facility], ED for evaluation of cough and shortness of breath. Patient was in his usual state of health until 2 weeks ago he started having cough and cold symptoms. He reports having productive cough with yellow sputum with associated chest congestion, body aches, chills and loss of taste. He tried OTC cough drops and Mucinex without significant improvement.  He had associated shortness of breath that has progressed over the last 2 weeks    Assessment and Plan: Community-acquired pneumonia Kidney transplant patient with 2 weeks of cold and cough symptoms, sputum production and shortness of breath found to have possible infiltrate on CXR.  Patient is afebrile and without leukocytosis however he is on immunosuppressive medications and has new O2 requirement. COVID and flu negative. Will continue IV antibiotics and monitor respiratory status closely. -Continue IV Rocephin, change azithromycin to oral -increase home prednisone -nebs scheduled -flutter valve -wean O2 -sputum cx if able   +blood culture: -? Contaminant -monitor for response  Acute hypoxic respiratory failure Patient with history of tobacco use disorder and kidney transplant found to to be hypoxic with SpO2 in the high 80s requiring supplemental oxygen to maintain O2 above 92%.  He is in no respiratory distress. CXR shows possible bibasilar infiltrates/atelectasis. No signs of heart failure exacerbation on exam.  He does have coarse crackles as well as mild expiratory wheezes on lung auscultation.  -wean to RA   Kidney transplant History of kidney transplant back in 2010. Current medication includes mycophenolate, tacrolimus and  prednisone.  Creatinine 2.17, around his baseline of 2. Creatinine of 2.01 4 months ago. Follows with nephrology at [facility]. -Avoid nephrotoxic agents -Tacrolimus 6.5 mg twice daily -Mycophenolate 1000 mg twice daily -Prednisone on 5 mg daily    HTN BP elevated with SBP in the 140s to 150s. -Resume home amlodipine 10 mg daily, labetalol 1200 mg twice daily and losartan 100 mg daily   Hx vitamin D deficiency Last vitamin D 19.0 4 months ago. -Resume home vitamin D3 1000 units twice daily    Hx hypomagnesemia -replete   Possible OSA -Sleep study in the outpatient   Tobacco use disorder -encourage cessation     DVT prophylaxis: enoxaparin (LOVENOX) injection 40 mg Start: 07/03/23 1100    Code Status: Full Code Family Communication: at bedside  Disposition Plan:  Level of care: Telemetry Medical Status is: Inpatient   Consultants:  none   Subjective: + cough  Objective: Vitals:   07/04/23 0416 07/04/23 0812 07/04/23 1011 07/04/23 1014  BP: 116/75 123/76 133/89 133/89  Pulse: 81 84 90 90  Resp: 18 17    Temp: (!) 97 F (36.1 C) 97.9 F (36.6 C)    TempSrc:  Oral    SpO2: 95% 94%    Weight:      Height:       No intake or output data in the 24 hours ending 07/04/23 1252 Filed Weights   07/02/23 2329  Weight: 77.1 kg    Examination:   General: Appearance:     Overweight male in no acute distress     Lungs:     respirations unlabored  Heart:    Normal heart rate.     MS:  All extremities are intact.    Neurologic:   Awake, alert       Data Reviewed: I have personally reviewed following labs and imaging studies  CBC: Recent Labs  Lab 07/02/23 2352 07/04/23 0514  WBC 10.4 10.7*  NEUTROABS 6.1  --   HGB 13.5 12.7*  HCT 39.0 37.5*  MCV 70.8* 72.3*  PLT 306 271   Basic Metabolic Panel: Recent Labs  Lab 07/02/23 2352 07/03/23 1045 07/04/23 0514  NA 136  --  139  K 3.9  --  4.3  CL 105  --  107  CO2 22  --  23  GLUCOSE 103*  --   99  BUN 32*  --  28*  CREATININE 2.17*  --  2.38*  CALCIUM 9.0  --  9.1  MG  --  1.6* 2.3  PHOS  --  3.9  --    GFR: Estimated Creatinine Clearance: 37.8 mL/min (A) (by C-G formula based on SCr of 2.38 mg/dL (H)). Liver Function Tests: Recent Labs  Lab 07/02/23 2352  AST 20  ALT 18  ALKPHOS 48  BILITOT 0.5  PROT 7.4  ALBUMIN 3.5   No results for input(s): "LIPASE", "AMYLASE" in the last 168 hours. No results for input(s): "AMMONIA" in the last 168 hours. Coagulation Profile: No results for input(s): "INR", "PROTIME" in the last 168 hours. Cardiac Enzymes: No results for input(s): "CKTOTAL", "CKMB", "CKMBINDEX", "TROPONINI" in the last 168 hours. BNP (last 3 results) No results for input(s): "PROBNP" in the last 8760 hours. HbA1C: No results for input(s): "HGBA1C" in the last 72 hours. CBG: No results for input(s): "GLUCAP" in the last 168 hours. Lipid Profile: No results for input(s): "CHOL", "HDL", "LDLCALC", "TRIG", "CHOLHDL", "LDLDIRECT" in the last 72 hours. Thyroid Function Tests: No results for input(s): "TSH", "T4TOTAL", "FREET4", "T3FREE", "THYROIDAB" in the last 72 hours. Anemia Panel: No results for input(s): "VITAMINB12", "FOLATE", "FERRITIN", "TIBC", "IRON", "RETICCTPCT" in the last 72 hours. Sepsis Labs: Recent Labs  Lab 07/02/23 2352 07/03/23 0149  LATICACIDVEN 0.7 0.7    Recent Results (from the past 240 hour(s))  Resp panel by RT-PCR (RSV, Flu A&B, Covid) Anterior Nasal Swab     Status: None   Collection Time: 07/02/23 11:33 PM   Specimen: Anterior Nasal Swab  Result Value Ref Range Status   SARS Coronavirus 2 by RT PCR NEGATIVE NEGATIVE Final    Comment: (NOTE) SARS-CoV-2 target nucleic acids are NOT DETECTED.  The SARS-CoV-2 RNA is generally detectable in upper respiratory specimens during the acute phase of infection. The lowest concentration of SARS-CoV-2 viral copies this assay can detect is 138 copies/mL. A negative result does not  preclude SARS-Cov-2 infection and should not be used as the sole basis for treatment or other patient management decisions. A negative result may occur with  improper specimen collection/handling, submission of specimen other than nasopharyngeal swab, presence of viral mutation(s) within the areas targeted by this assay, and inadequate number of viral copies(<138 copies/mL). A negative result must be combined with clinical observations, patient history, and epidemiological information. The expected result is Negative.  Fact Sheet for Patients:  BloggerCourse.com  Fact Sheet for Healthcare Providers:  SeriousBroker.it  This test is no t yet approved or cleared by the [place] FDA and  has been authorized for detection and/or diagnosis of SARS-CoV-2 by FDA under an Emergency Use Authorization (EUA). This EUA will remain  in effect (meaning this test can be used) for the duration of the COVID-19 declaration  under Section 564(b)(1) of the Act, 21 U.S.C.section 360bbb-3(b)(1), unless the authorization is terminated  or revoked sooner.       Influenza A by PCR NEGATIVE NEGATIVE Final   Influenza B by PCR NEGATIVE NEGATIVE Final    Comment: (NOTE) The Xpert Xpress SARS-CoV-2/FLU/RSV plus assay is intended as an aid in the diagnosis of influenza from Nasopharyngeal swab specimens and should not be used as a sole basis for treatment. Nasal washings and aspirates are unacceptable for Xpert Xpress SARS-CoV-2/FLU/RSV testing.  Fact Sheet for Patients: BloggerCourse.com  Fact Sheet for Healthcare Providers: SeriousBroker.it  This test is not yet approved or cleared by the [place] FDA and has been authorized for detection and/or diagnosis of SARS-CoV-2 by FDA under an Emergency Use Authorization (EUA). This EUA will remain in effect (meaning this test can be used) for the  duration of the COVID-19 declaration under Section 564(b)(1) of the Act, 21 U.S.C. section 360bbb-3(b)(1), unless the authorization is terminated or revoked.     Resp Syncytial Virus by PCR NEGATIVE NEGATIVE Final    Comment: (NOTE) Fact Sheet for Patients: BloggerCourse.com  Fact Sheet for Healthcare Providers: SeriousBroker.it  This test is not yet approved or cleared by the [place] FDA and has been authorized for detection and/or diagnosis of SARS-CoV-2 by FDA under an Emergency Use Authorization (EUA). This EUA will remain in effect (meaning this test can be used) for the duration of the COVID-19 declaration under Section 564(b)(1) of the Act, 21 U.S.C. section 360bbb-3(b)(1), unless the authorization is terminated or revoked.  Performed at [facility], [address] [place], [place] [address]   Blood culture (routine x 2)     Status: None (Preliminary result)   Collection Time: 07/03/23 12:21 AM   Specimen: BLOOD  Result Value Ref Range Status   Specimen Description   Final    BLOOD RIGHT ANTECUBITAL Performed at [facility], [address] [place], [place] [address]    Special Requests   Final    BOTTLES DRAWN AEROBIC AND ANAEROBIC Blood Culture adequate volume Performed at [facility], [address] [place], [place] [address]    Culture   Final    NO GROWTH < 24 HOURS Performed at [facility], [address], [place], [place] [address]    Report Status PENDING  Incomplete  Blood culture (routine x 2)     Status: None (Preliminary result)   Collection Time: 07/03/23 12:55 AM   Specimen: BLOOD LEFT HAND  Result Value Ref Range Status   Specimen Description   Final    BLOOD LEFT HAND Performed at [facility], [address] [place], [place] [address]    Special Requests   Final    BOTTLES DRAWN AEROBIC AND ANAEROBIC Blood Culture results may not be  optimal due to an excessive volume of blood received in culture bottles Performed at [facility], [address] [place], [place] [address]    Culture  Setup Time   Final    GRAM POSITIVE COCCI IN CLUSTERS AEROBIC BOTTLE ONLY CRITICAL RESULT CALLED TO, READ BACK BY AND VERIFIED WITH: PHARMD J. LEDFORD 07/04/23 @ 0410 BY AB Performed at [facility], [address], [place], [place] [address]    Culture [facility] POSITIVE COCCI  Final   Report Status PENDING  Incomplete  Blood Culture ID Panel (Reflexed)     Status: Abnormal  Collection Time: 07/03/23 12:55 AM  Result Value Ref Range Status   Enterococcus faecalis NOT DETECTED NOT DETECTED Final   Enterococcus Faecium NOT DETECTED NOT DETECTED Final   Listeria monocytogenes NOT DETECTED NOT DETECTED Final   Staphylococcus species DETECTED (A) NOT DETECTED Final    Comment: CRITICAL RESULT CALLED TO, READ BACK BY AND VERIFIED WITH: PHARMD J. LEDFORD 07/04/23 @ 0410 BY AB    Staphylococcus aureus (BCID) NOT DETECTED NOT DETECTED Final   Staphylococcus epidermidis DETECTED (A) NOT DETECTED Final    Comment: Methicillin (oxacillin) resistant coagulase negative staphylococcus. Possible blood culture contaminant (unless isolated from more than one blood culture draw or clinical case suggests pathogenicity). No antibiotic treatment is indicated for blood  culture contaminants. CRITICAL RESULT CALLED TO, READ BACK BY AND VERIFIED WITH: PHARMD J. LEDFORD 07/04/23 @ 0410 BY AB    Staphylococcus lugdunensis NOT DETECTED NOT DETECTED Final   Streptococcus species NOT DETECTED NOT DETECTED Final   Streptococcus agalactiae NOT DETECTED NOT DETECTED Final   Streptococcus pneumoniae NOT DETECTED NOT DETECTED Final   Streptococcus pyogenes NOT DETECTED NOT DETECTED Final   A.calcoaceticus-baumannii NOT DETECTED NOT DETECTED Final   Bacteroides fragilis NOT DETECTED NOT DETECTED Final   Enterobacterales NOT DETECTED NOT DETECTED Final    Enterobacter cloacae complex NOT DETECTED NOT DETECTED Final   Escherichia coli NOT DETECTED NOT DETECTED Final   Klebsiella aerogenes NOT DETECTED NOT DETECTED Final   Klebsiella oxytoca NOT DETECTED NOT DETECTED Final   Klebsiella pneumoniae NOT DETECTED NOT DETECTED Final   Proteus species NOT DETECTED NOT DETECTED Final   Salmonella species NOT DETECTED NOT DETECTED Final   Serratia marcescens NOT DETECTED NOT DETECTED Final   Haemophilus influenzae NOT DETECTED NOT DETECTED Final   Neisseria meningitidis NOT DETECTED NOT DETECTED Final   Pseudomonas aeruginosa NOT DETECTED NOT DETECTED Final   Stenotrophomonas maltophilia NOT DETECTED NOT DETECTED Final   Candida albicans NOT DETECTED NOT DETECTED Final   Candida auris NOT DETECTED NOT DETECTED Final   Candida glabrata NOT DETECTED NOT DETECTED Final   Candida krusei NOT DETECTED NOT DETECTED Final   Candida parapsilosis NOT DETECTED NOT DETECTED Final   Candida tropicalis NOT DETECTED NOT DETECTED Final   Cryptococcus neoformans/gattii NOT DETECTED NOT DETECTED Final   Methicillin resistance mecA/C DETECTED (A) NOT DETECTED Final    Comment: CRITICAL RESULT CALLED TO, READ BACK BY AND VERIFIED WITH: PHARMD J. LEDFORD 07/04/23 @ 0410 BY AB Performed at [facility], [address] [place], [place] [address]          Radiology Studies: DG Chest 2 View  Result Date: 07/03/2023 CLINICAL DATA:  Cough. EXAM: CHEST - 2 VIEW COMPARISON:  September 14, 2016 FINDINGS: The heart size and mediastinal contours are within normal limits. Low lung volumes are noted with mild areas of atelectasis and/or infiltrate seen within the bilateral lung bases. There is a small, stable right pleural effusion versus pleural thickening. No pneumothorax is identified. The visualized skeletal structures are unremarkable. IMPRESSION: 1. Low lung volumes with mild bibasilar atelectasis and/or infiltrate. 2. Small right pleural effusion versus pleural  thickening. Electronically Signed   By: Aram Candela M.D.   On: 07/03/2023 00:44        Scheduled Meds:  amLODipine  10 mg Oral Daily   azithromycin  500 mg Oral Daily   cholecalciferol  1,000 Units Oral BID   dextromethorphan-guaiFENesin  1 tablet Oral BID   enoxaparin (LOVENOX) injection  40 mg Subcutaneous  Q24H   labetalol  1,200 mg Oral BID   losartan  100 mg Oral Daily   magnesium oxide  400 mg Oral Daily   mycophenolate  1,000 mg Oral BID   prednisoLONE  5 mg Oral Daily   tacrolimus  0.5 mg Oral BID   tacrolimus  6 mg Oral BID   Continuous Infusions:  cefTRIAXone (ROCEPHIN)  IV 1 g (07/04/23 1026)     LOS: 1 day    Time spent: 45 minutes spent on chart review, discussion with nursing staff, consultants, updating family and interview/physical exam; more than 50% of that time was spent in counseling and/or coordination of care.    Joseph Art, DO [facility] Available via Epic secure chat 7am-7pm After these hours, please refer to coverage provider listed on [email or web address] 07/04/2023, 12:52 PM

## 2023-07-04 NOTE — Plan of Care (Signed)

## 2023-07-04 NOTE — Plan of Care (Signed)
  Problem: Education: Goal: Knowledge of General Education information will improve Description: Including pain rating scale, medication(s)/side effects and non-pharmacologic comfort measures Outcome: Progressing   Problem: Activity: Goal: Risk for activity intolerance will decrease Outcome: Progressing   Problem: Nutrition: Goal: Adequate nutrition will be maintained Outcome: Progressing   Problem: Coping: Goal: Level of anxiety will decrease Outcome: Progressing   Problem: Pain Management: Goal: General experience of comfort will improve Outcome: Progressing

## 2023-07-04 NOTE — Progress Notes (Signed)
PHARMACY - PHYSICIAN COMMUNICATION CRITICAL VALUE ALERT - BLOOD CULTURE IDENTIFICATION (BCID)  Marcus Greene is an 42 y.o. male who presented to [facility] on 07/02/2023 with a chief complaint of cough   Name of physician (or Provider) Contacted: Dr. Margo Aye  Current antibiotics: Ceftriaxone, Azithromycin   Changes to prescribed antibiotics recommended:  Likely contaminant  No changes for now Consider vancomycin if not improving clinically  Results for orders placed or performed during the hospital encounter of 07/02/23  Blood Culture ID Panel (Reflexed) (Collected: 07/03/2023 12:55 AM)  Result Value Ref Range   Enterococcus faecalis NOT DETECTED NOT DETECTED   Enterococcus Faecium NOT DETECTED NOT DETECTED   Listeria monocytogenes NOT DETECTED NOT DETECTED   Staphylococcus species DETECTED (A) NOT DETECTED   Staphylococcus aureus (BCID) NOT DETECTED NOT DETECTED   Staphylococcus epidermidis DETECTED (A) NOT DETECTED   Staphylococcus lugdunensis NOT DETECTED NOT DETECTED   Streptococcus species NOT DETECTED NOT DETECTED   Streptococcus agalactiae NOT DETECTED NOT DETECTED   Streptococcus pneumoniae NOT DETECTED NOT DETECTED   Streptococcus pyogenes NOT DETECTED NOT DETECTED   A.calcoaceticus-baumannii NOT DETECTED NOT DETECTED   Bacteroides fragilis NOT DETECTED NOT DETECTED   Enterobacterales NOT DETECTED NOT DETECTED   Enterobacter cloacae complex NOT DETECTED NOT DETECTED   Escherichia coli NOT DETECTED NOT DETECTED   Klebsiella aerogenes NOT DETECTED NOT DETECTED   Klebsiella oxytoca NOT DETECTED NOT DETECTED   Klebsiella pneumoniae NOT DETECTED NOT DETECTED   Proteus species NOT DETECTED NOT DETECTED   Salmonella species NOT DETECTED NOT DETECTED   Serratia marcescens NOT DETECTED NOT DETECTED   Haemophilus influenzae NOT DETECTED NOT DETECTED   Neisseria meningitidis NOT DETECTED NOT DETECTED   Pseudomonas aeruginosa NOT DETECTED NOT DETECTED   Stenotrophomonas  maltophilia NOT DETECTED NOT DETECTED   Candida albicans NOT DETECTED NOT DETECTED   Candida auris NOT DETECTED NOT DETECTED   Candida glabrata NOT DETECTED NOT DETECTED   Candida krusei NOT DETECTED NOT DETECTED   Candida parapsilosis NOT DETECTED NOT DETECTED   Candida tropicalis NOT DETECTED NOT DETECTED   Cryptococcus neoformans/gattii NOT DETECTED NOT DETECTED   Methicillin resistance mecA/C DETECTED (A) NOT DETECTED    Marcus Greene 07/04/2023  5:00 AM

## 2023-07-05 ENCOUNTER — Inpatient Hospital Stay (HOSPITAL_COMMUNITY): Payer: Managed Care, Other (non HMO)

## 2023-07-05 DIAGNOSIS — J189 Pneumonia, unspecified organism: Secondary | ICD-10-CM | POA: Diagnosis not present

## 2023-07-05 LAB — CBC
HCT: 37.4 % — ABNORMAL LOW (ref 39.0–52.0)
Hemoglobin: 12.8 g/dL — ABNORMAL LOW (ref 13.0–17.0)
MCH: 24.3 pg — ABNORMAL LOW (ref 26.0–34.0)
MCHC: 34.2 g/dL (ref 30.0–36.0)
MCV: 71.1 fL — ABNORMAL LOW (ref 80.0–100.0)
Platelets: 295 10*3/uL (ref 150–400)
RBC: 5.26 MIL/uL (ref 4.22–5.81)
RDW: 15.8 % — ABNORMAL HIGH (ref 11.5–15.5)
WBC: 8.6 10*3/uL (ref 4.0–10.5)
nRBC: 0 % (ref 0.0–0.2)

## 2023-07-05 LAB — LEGIONELLA PNEUMOPHILA SEROGP 1 UR AG: L. pneumophila Serogp 1 Ur Ag: NEGATIVE

## 2023-07-05 LAB — BASIC METABOLIC PANEL
Anion gap: 8 (ref 5–15)
BUN: 29 mg/dL — ABNORMAL HIGH (ref 6–20)
CO2: 21 mmol/L — ABNORMAL LOW (ref 22–32)
Calcium: 9.4 mg/dL (ref 8.9–10.3)
Chloride: 109 mmol/L (ref 98–111)
Creatinine, Ser: 2.58 mg/dL — ABNORMAL HIGH (ref 0.61–1.24)
GFR, Estimated: 31 mL/min — ABNORMAL LOW (ref >60)
Glucose, Bld: 95 mg/dL (ref 70–99)
Potassium: 3.8 mmol/L (ref 3.5–5.1)
Sodium: 138 mmol/L (ref 135–145)

## 2023-07-05 LAB — PROCALCITONIN: Procalcitonin: <0.1 ng/mL

## 2023-07-05 MED ORDER — FUROSEMIDE 10 MG/ML IJ SOLN
20.0000 mg | Freq: Once | INTRAMUSCULAR | Status: AC
  Administered 2023-07-05: 20 mg via INTRAVENOUS
  Filled 2023-07-05: qty 2

## 2023-07-05 MED ORDER — DOXYCYCLINE HYCLATE 100 MG PO TABS: 100.0000 mg | ORAL_TABLET | Freq: Two times a day (BID) | ORAL | 0 refills | Status: DC

## 2023-07-05 MED ORDER — AMLODIPINE BESYLATE 5 MG PO TABS: 5.0000 mg | ORAL_TABLET | Freq: Every day | ORAL | Status: DC

## 2023-07-05 MED ORDER — PREDNISONE 20 MG PO TABS: 40.0000 mg | ORAL_TABLET | Freq: Every day | ORAL | 0 refills | Status: DC

## 2023-07-05 MED ORDER — ALBUTEROL SULFATE (2.5 MG/3ML) 0.083% IN NEBU: 2.5000 mg | INHALATION_SOLUTION | RESPIRATORY_TRACT | 0 refills | Status: DC | PRN

## 2023-07-05 MED ORDER — PREDNISONE 5 MG PO TABS: 5.0000 mg | ORAL_TABLET | Freq: Every day | ORAL | Status: AC

## 2023-07-05 MED ORDER — DM-GUAIFENESIN ER 30-600 MG PO TB12: 1.0000 | ORAL_TABLET | Freq: Two times a day (BID) | ORAL | Status: DC

## 2023-07-05 MED ORDER — PREDNISONE 20 MG PO TABS: 40.0000 mg | ORAL_TABLET | Freq: Every day | ORAL | 0 refills | Status: AC

## 2023-07-05 MED ORDER — DOXYCYCLINE HYCLATE 100 MG PO TABS
100.0000 mg | ORAL_TABLET | Freq: Two times a day (BID) | ORAL | Status: DC
  Administered 2023-07-05: 100 mg via ORAL
  Filled 2023-07-05: qty 1

## 2023-07-05 MED ORDER — ALBUTEROL SULFATE (2.5 MG/3ML) 0.083% IN NEBU: 2.5000 mg | INHALATION_SOLUTION | RESPIRATORY_TRACT | 0 refills | Status: AC | PRN

## 2023-07-05 NOTE — Discharge Summary (Signed)
Physician Discharge Summary  Marcus Greene YNW:[id number] DOB: 1981-05-10 DOA: 07/02/2023  PCP: No primary care provider on file.  Admit date: 07/02/2023 Discharge date: 07/05/2023  Admitted From: home Discharge disposition: home   Recommendations for Outpatient Follow-Up:   PCP follow up to ensure resolution of PNA BMP 1 week Outpatient sleep study Follow sputum culture to final   Discharge Diagnosis:   Principal Problem:   CAP (community acquired pneumonia) Active Problems:   Community acquired pneumonia   Acute respiratory failure with hypoxia (HCC)   Hypomagnesemia   Tobacco use disorder    Discharge Condition: Improved.  Diet recommendation: Low sodium, heart healthy.   Wound care: None.  Code status: Full.   History of Present Illness:   Marcus Greene is a 42 y.o. male with medical history significant of HTN, kidney transplant in 2010 on immunosuppression, and seizure disorder who presented to the [facility], ED for evaluation of cough and shortness of breath. Patient was in his usual state of health until 2 weeks ago he started having cough and cold symptoms. He reports having productive cough with yellow sputum with associated chest congestion, body aches, chills and loss of taste. He tried OTC cough drops and Mucinex without significant improvement.  He had associated shortness of breath that has progressed over the last 2 weeks. He endorsed some chest tightness when coughing but denies chest pain, headaches, dizziness, vision changes, nausea, vomiting, dysuria, leg swelling or orthopnea. Per spouse, patient snores at night and occasionally has apneic episodes while sleeping. Patient endorses not getting enough sleep and feeling fatigue during the day.    Hospital Course by Problem:   Community-acquired pneumonia Kidney transplant patient with 2 weeks of cold and cough symptoms, sputum production and shortness of breath found to have  possible infiltrate on CXR.  Patient is afebrile and without leukocytosis however he is on immunosuppressive medications and has new O2 requirement. COVID and flu negative. Will continue IV antibiotics and monitor respiratory status closely. -Change to oral abx to finished -increase home prednisone for burst steroids then back to home dose -nebs ordered-- has nebulizer at home -flutter valve -wean O2 -sputum cx pending   +blood culture: -probable Contaminant   Acute hypoxic respiratory failure Patient with history of tobacco use disorder and kidney transplant found to to be hypoxic with SpO2 in the high 80s requiring supplemental oxygen to maintain O2 above 92%.  He is in no respiratory distress. CXR shows possible bibasilar infiltrates/atelectasis -treat -weaned to RA   Kidney transplant History of kidney transplant back in 2010. Current medication includes mycophenolate, tacrolimus and prednisone.  Creatinine 2.17, around his baseline of 2. Creatinine of 2.01 4 months ago. Follows with nephrology at [facility]. -Avoid nephrotoxic agents -Tacrolimus 6.5 mg twice daily -Mycophenolate 1000 mg twice daily -Prednisone on 5 mg daily- after burst dose    HTN BP elevated with SBP in the 140s to 150s. -Resume home amlodipine 10 mg daily, labetalol 1200 mg twice daily and losartan 100 mg daily   Hx vitamin D deficiency Last vitamin D 19.0 4 months ago. -Resume home vitamin D3 1000 units twice daily     Hx hypomagnesemia -repleted   Possible OSA -Sleep study in the outpatient   Tobacco use disorder -encourage cessation      Medical Consultants:      Discharge Exam:   Vitals:   07/05/23 1140 07/05/23 1225  BP:  100/66  Pulse: 75 72  Resp: 20   Temp:  SpO2: 99%    Vitals:   07/05/23 1127 07/05/23 1135 07/05/23 1140 07/05/23 1225  BP:    100/66  Pulse:   75 72  Resp:   20   Temp:      TempSrc:      SpO2: (!) 89% 95% 99%   Weight:      Height:        General  exam: Appears calm and comfortable.    The results of significant diagnostics from this hospitalization (including imaging, microbiology, ancillary and laboratory) are listed below for reference.     Procedures and Diagnostic Studies:   DG Chest 2 View  Result Date: 07/03/2023 CLINICAL DATA:  Cough. EXAM: CHEST - 2 VIEW COMPARISON:  September 14, 2016 FINDINGS: The heart size and mediastinal contours are within normal limits. Low lung volumes are noted with mild areas of atelectasis and/or infiltrate seen within the bilateral lung bases. There is a small, stable right pleural effusion versus pleural thickening. No pneumothorax is identified. The visualized skeletal structures are unremarkable. IMPRESSION: 1. Low lung volumes with mild bibasilar atelectasis and/or infiltrate. 2. Small right pleural effusion versus pleural thickening. Electronically Signed   By: Aram Candela M.D.   On: 07/03/2023 00:44     Labs:   Basic Metabolic Panel: Recent Labs  Lab 07/02/23 2352 07/03/23 1045 07/04/23 0514 07/05/23 0411  NA 136  --  139 138  K 3.9  --  4.3 3.8  CL 105  --  107 109  CO2 22  --  23 21*  GLUCOSE 103*  --  99 95  BUN 32*  --  28* 29*  CREATININE 2.17*  --  2.38* 2.58*  CALCIUM 9.0  --  9.1 9.4  MG  --  1.6* 2.3  --   PHOS  --  3.9  --   --    GFR Estimated Creatinine Clearance: 34.9 mL/min (A) (by C-G formula based on SCr of 2.58 mg/dL (H)). Liver Function Tests: Recent Labs  Lab 07/02/23 2352  AST 20  ALT 18  ALKPHOS 48  BILITOT 0.5  PROT 7.4  ALBUMIN 3.5   No results for input(s): "LIPASE", "AMYLASE" in the last 168 hours. No results for input(s): "AMMONIA" in the last 168 hours. Coagulation profile No results for input(s): "INR", "PROTIME" in the last 168 hours.  CBC: Recent Labs  Lab 07/02/23 2352 07/04/23 0514 07/05/23 0411  WBC 10.4 10.7* 8.6  NEUTROABS 6.1  --   --   HGB 13.5 12.7* 12.8*  HCT 39.0 37.5* 37.4*  MCV 70.8* 72.3* 71.1*  PLT 306 271  295   Cardiac Enzymes: No results for input(s): "CKTOTAL", "CKMB", "CKMBINDEX", "TROPONINI" in the last 168 hours. BNP: Invalid input(s): "POCBNP" CBG: No results for input(s): "GLUCAP" in the last 168 hours. D-Dimer No results for input(s): "DDIMER" in the last 72 hours. Hgb A1c No results for input(s): "HGBA1C" in the last 72 hours. Lipid Profile No results for input(s): "CHOL", "HDL", "LDLCALC", "TRIG", "CHOLHDL", "LDLDIRECT" in the last 72 hours. Thyroid function studies No results for input(s): "TSH", "T4TOTAL", "T3FREE", "THYROIDAB" in the last 72 hours.  Invalid input(s): "FREET3" Anemia work up No results for input(s): "VITAMINB12", "FOLATE", "FERRITIN", "TIBC", "IRON", "RETICCTPCT" in the last 72 hours. Microbiology Recent Results (from the past 240 hour(s))  Resp panel by RT-PCR (RSV, Flu A&B, Covid) Anterior Nasal Swab     Status: None   Collection Time: 07/02/23 11:33 PM   Specimen: Anterior Nasal Swab  Result  Value Ref Range Status   SARS Coronavirus 2 by RT PCR NEGATIVE NEGATIVE Final    Comment: (NOTE) SARS-CoV-2 target nucleic acids are NOT DETECTED.  The SARS-CoV-2 RNA is generally detectable in upper respiratory specimens during the acute phase of infection. The lowest concentration of SARS-CoV-2 viral copies this assay can detect is 138 copies/mL. A negative result does not preclude SARS-Cov-2 infection and should not be used as the sole basis for treatment or other patient management decisions. A negative result may occur with  improper specimen collection/handling, submission of specimen other than nasopharyngeal swab, presence of viral mutation(s) within the areas targeted by this assay, and inadequate number of viral copies(<138 copies/mL). A negative result must be combined with clinical observations, patient history, and epidemiological information. The expected result is Negative.  Fact Sheet for Patients:   BloggerCourse.com  Fact Sheet for Healthcare Providers:  SeriousBroker.it  This test is no t yet approved or cleared by the [place] FDA and  has been authorized for detection and/or diagnosis of SARS-CoV-2 by FDA under an Emergency Use Authorization (EUA). This EUA will remain  in effect (meaning this test can be used) for the duration of the COVID-19 declaration under Section 564(b)(1) of the Act, 21 U.S.C.section 360bbb-3(b)(1), unless the authorization is terminated  or revoked sooner.       Influenza A by PCR NEGATIVE NEGATIVE Final   Influenza B by PCR NEGATIVE NEGATIVE Final    Comment: (NOTE) The Xpert Xpress SARS-CoV-2/FLU/RSV plus assay is intended as an aid in the diagnosis of influenza from Nasopharyngeal swab specimens and should not be used as a sole basis for treatment. Nasal washings and aspirates are unacceptable for Xpert Xpress SARS-CoV-2/FLU/RSV testing.  Fact Sheet for Patients: BloggerCourse.com  Fact Sheet for Healthcare Providers: SeriousBroker.it  This test is not yet approved or cleared by the [place] FDA and has been authorized for detection and/or diagnosis of SARS-CoV-2 by FDA under an Emergency Use Authorization (EUA). This EUA will remain in effect (meaning this test can be used) for the duration of the COVID-19 declaration under Section 564(b)(1) of the Act, 21 U.S.C. section 360bbb-3(b)(1), unless the authorization is terminated or revoked.     Resp Syncytial Virus by PCR NEGATIVE NEGATIVE Final    Comment: (NOTE) Fact Sheet for Patients: BloggerCourse.com  Fact Sheet for Healthcare Providers: SeriousBroker.it  This test is not yet approved or cleared by the [place] FDA and has been authorized for detection and/or diagnosis of SARS-CoV-2 by FDA under an Emergency Use  Authorization (EUA). This EUA will remain in effect (meaning this test can be used) for the duration of the COVID-19 declaration under Section 564(b)(1) of the Act, 21 U.S.C. section 360bbb-3(b)(1), unless the authorization is terminated or revoked.  Performed at [facility], [address] [place], [place] [address]   Blood culture (routine x 2)     Status: None (Preliminary result)   Collection Time: 07/03/23 12:21 AM   Specimen: BLOOD  Result Value Ref Range Status   Specimen Description   Final    BLOOD RIGHT ANTECUBITAL Performed at [facility], [address] [place], [place] [address]    Special Requests   Final    BOTTLES DRAWN AEROBIC AND ANAEROBIC Blood Culture adequate volume Performed at [facility], [address] [place], [place] [address]    Culture   Final    NO GROWTH 2 DAYS Performed at [facility],  [address] [place], [place] [address]    Report Status PENDING  Incomplete  Blood culture (routine x 2)     Status: None (Preliminary result)   Collection Time: 07/03/23 12:55 AM   Specimen: BLOOD LEFT HAND  Result Value Ref Range Status   Specimen Description   Final    BLOOD LEFT HAND Performed at [facility], [address] [place], [place] [address]    Special Requests   Final    BOTTLES DRAWN AEROBIC AND ANAEROBIC Blood Culture results may not be optimal due to an excessive volume of blood received in culture bottles Performed at [facility], [address] [place], [place] [address]    Culture  Setup Time   Final    GRAM POSITIVE COCCI IN CLUSTERS AEROBIC BOTTLE ONLY CRITICAL RESULT CALLED TO, READ BACK BY AND VERIFIED WITH: PHARMD J. LEDFORD 07/04/23 @ 0410 BY AB    Culture   Final    GRAM POSITIVE COCCI CULTURE REINCUBATED FOR BETTER GROWTH Performed at [facility], [address] [place], [place] [address]    Report Status PENDING  Incomplete  Blood Culture ID  Panel (Reflexed)     Status: Abnormal   Collection Time: 07/03/23 12:55 AM  Result Value Ref Range Status   Enterococcus faecalis NOT DETECTED NOT DETECTED Final   Enterococcus Faecium NOT DETECTED NOT DETECTED Final   Listeria monocytogenes NOT DETECTED NOT DETECTED Final   Staphylococcus species DETECTED (A) NOT DETECTED Final    Comment: CRITICAL RESULT CALLED TO, READ BACK BY AND VERIFIED WITH: PHARMD J. LEDFORD 07/04/23 @ 0410 BY AB    Staphylococcus aureus (BCID) NOT DETECTED NOT DETECTED Final   Staphylococcus epidermidis DETECTED (A) NOT DETECTED Final    Comment: Methicillin (oxacillin) resistant coagulase negative staphylococcus. Possible blood culture contaminant (unless isolated from more than one blood culture draw or clinical case suggests pathogenicity). No antibiotic treatment is indicated for blood  culture contaminants. CRITICAL RESULT CALLED TO, READ BACK BY AND VERIFIED WITH: PHARMD J. LEDFORD 07/04/23 @ 0410 BY AB    Staphylococcus lugdunensis NOT DETECTED NOT DETECTED Final   Streptococcus species NOT DETECTED NOT DETECTED Final   Streptococcus agalactiae NOT DETECTED NOT DETECTED Final   Streptococcus pneumoniae NOT DETECTED NOT DETECTED Final   Streptococcus pyogenes NOT DETECTED NOT DETECTED Final   A.calcoaceticus-baumannii NOT DETECTED NOT DETECTED Final   Bacteroides fragilis NOT DETECTED NOT DETECTED Final   Enterobacterales NOT DETECTED NOT DETECTED Final   Enterobacter cloacae complex NOT DETECTED NOT DETECTED Final   Escherichia coli NOT DETECTED NOT DETECTED Final   Klebsiella aerogenes NOT DETECTED NOT DETECTED Final   Klebsiella oxytoca NOT DETECTED NOT DETECTED Final   Klebsiella pneumoniae NOT DETECTED NOT DETECTED Final   Proteus species NOT DETECTED NOT DETECTED Final   Salmonella species NOT DETECTED NOT DETECTED Final   Serratia marcescens NOT DETECTED NOT DETECTED Final   Haemophilus influenzae NOT DETECTED NOT DETECTED Final   Neisseria  meningitidis NOT DETECTED NOT DETECTED Final   Pseudomonas aeruginosa NOT DETECTED NOT DETECTED Final   Stenotrophomonas maltophilia NOT DETECTED NOT DETECTED Final   Candida albicans NOT DETECTED NOT DETECTED Final   Candida auris NOT DETECTED NOT DETECTED Final   Candida glabrata NOT DETECTED NOT DETECTED Final   Candida krusei NOT DETECTED NOT DETECTED Final   Candida parapsilosis NOT DETECTED NOT DETECTED Final   Candida tropicalis NOT DETECTED NOT DETECTED Final   Cryptococcus neoformans/gattii  NOT DETECTED NOT DETECTED Final   Methicillin resistance mecA/C DETECTED (A) NOT DETECTED Final    Comment: CRITICAL RESULT CALLED TO, READ BACK BY AND VERIFIED WITH: PHARMD J. LEDFORD 07/04/23 @ 0410 BY AB Performed at [facility], [address], [place], [place] [address]   Expectorated Sputum Assessment w Gram Stain, Rflx to Resp Cult     Status: None   Collection Time: 07/03/23 10:00 AM   Specimen: Sputum  Result Value Ref Range Status   Specimen Description SPUTUM  Final   Special Requests NONE  Final   Sputum evaluation   Final    THIS SPECIMEN IS ACCEPTABLE FOR SPUTUM CULTURE Performed at [facility], [address], [place], [place] [address]    Report Status 07/04/2023 FINAL  Final  Culture, Respiratory w Gram Stain     Status: None (Preliminary result)   Collection Time: 07/03/23 10:00 AM   Specimen: SPU  Result Value Ref Range Status   Specimen Description SPUTUM  Final   Special Requests NONE Reflexed from [id number]  Final   Gram Stain   Final    FEW WBC PRESENT, PREDOMINANTLY PMN RARE GRAM POSITIVE COCCI RARE GRAM POSITIVE RODS RARE BUDDING YEAST SEEN Performed at [facility], [address], [place], [place] [address]    Culture PENDING  Incomplete   Report Status PENDING  Incomplete     Discharge Instructions:   Discharge Instructions     Diet general   Complete by: As directed    Discharge instructions   Complete by: As directed     Continue flutter valve hourly while awake Continue nebs as needed Would take steam showers to help loosen mucous   Increase activity slowly   Complete by: As directed       Allergies as of 07/05/2023       Reactions   Lisinopril Swelling   angioedema        Medication List     STOP taking these medications    levETIRAcetam 1000 MG tablet Commonly known as: KEPPRA       TAKE these medications    albuterol (2.5 MG/3ML) 0.083% nebulizer solution Commonly known as: PROVENTIL Take 3 mLs (2.5 mg total) by nebulization every 4 (four) hours as needed for wheezing or shortness of breath (sputum).   amLODipine 10 MG tablet Commonly known as: NORVASC Take 10 mg by mouth daily.   cholecalciferol 1000 units tablet Commonly known as: VITAMIN D Take 1,000 Units by mouth daily.   dextromethorphan-guaiFENesin 30-600 MG 12hr tablet Commonly known as: MUCINEX DM Take 1 tablet by mouth 2 (two) times daily.   doxycycline 100 MG tablet Commonly known as: VIBRA-TABS Take 1 tablet (100 mg total) by mouth every 12 (twelve) hours.   labetalol 300 MG tablet Commonly known as: NORMODYNE Take 1,200 mg by mouth 2 (two) times daily.   losartan 100 MG tablet Commonly known as: COZAAR Take 100 mg by mouth daily.   magnesium oxide 400 MG tablet Commonly known as: MAG-OX Take 400 mg by mouth 2 (two) times daily.   mycophenolate 250 MG capsule Commonly known as: CELLCEPT Take 1,000 mg by mouth 2 (two) times daily.   predniSONE 20 MG tablet Commonly known as: DELTASONE Take 2 tablets (40 mg total) by mouth daily with breakfast for 3 days. After you finish this script, resume your regular 5mg  dose Start taking on: July 06, 2023 What changed: You were already taking a medication with  the same name, and this prescription was added. Make sure you understand how and when to take each.   predniSONE 5 MG tablet Commonly known as: DELTASONE Take 1 tablet (5 mg total) by mouth  daily. Start taking on: July 09, 2023 What changed: These instructions start on July 09, 2023. If you are unsure what to do until then, ask your doctor or other care provider.   Prograf 1 MG capsule Generic drug: tacrolimus Take 6 mg by mouth 2 (two) times daily.   tacrolimus 0.5 MG capsule Commonly known as: PROGRAF Take 0.5 mg by mouth 2 (two) times daily.        Follow-up Information     [facility] Follow up.   Specialty: Internal Medicine Why: Call to schedule an appointment to establish with a primary care provider Contact information: [address] [place] [address] [phone]                 Time coordinating discharge: 45 min  Signed:  Joseph Art DO  [facility] 07/05/2023, 1:14 PM

## 2023-07-05 NOTE — Discharge Instructions (Signed)
BMP 1-2 week Establish with PCP ASAP Stop smoking

## 2023-07-05 NOTE — Care Management (Signed)
Added [facility] (potentially shortest wait tme to schedule) to AVS for patient to call Monday to establish an appointment with a PCP

## 2023-07-06 LAB — CULTURE, BLOOD (ROUTINE X 2)

## 2023-07-06 LAB — TACROLIMUS LEVEL: Tacrolimus (FK506) - LabCorp: 4.2 ng/mL (ref 2.0–20.0)

## 2023-07-07 LAB — CULTURE, RESPIRATORY W GRAM STAIN: Culture: NORMAL

## 2023-07-08 LAB — CULTURE, BLOOD (ROUTINE X 2)
Culture: NO GROWTH
Special Requests: ADEQUATE

## 2023-07-20 ENCOUNTER — Ambulatory Visit: Payer: 59 | Admitting: Family Medicine

## 2024-02-17 NOTE — Progress Notes (Signed)
 Subjective:     Patient ID: Marcus Greene, male    DOB: 1980-10-07, 43 y.o.   MRN: [id number]  No chief complaint on file.   HPI  Marcus Greene 43 year old male patient presents to establish care.  He is married with 3 children. Presents to office visit with wife J, and young daughter. From [facility] [employer] [place], highest level education- 12th grade.  Currently works full-time at [employer].  Denies EtOH.  He is a current smoker, smokes 1 pack/day.  He has smoked since 43 years old.  Allergies: Seasonal  PSx-Kidney Transplant 2016   Hx Kidney Transplant- Followed by Dr. Bri[facility]-  Tacrolimus  0.5 mg twice daily, prednisone  5 mg daily, CellCept  1000 mg twice daily  Hypertension Amlodipine  10 mg daily, labetalol  1200 mg daily, losartan  100 mg daily- complaint with medications  Additional c/o concerns of possible sleep apnea. Reports feeling tired throughout the day. His wife has observed episodes during the night where he briefly stops breathing, snoring. She is concerned about these episodes. Patient denies known history of sleep apnea. No CPAP use. He has has sleep study conducted reportedly years ago that was inconclusive.   Patient denies fever, chills, SOB, CP, palpitations, dyspnea, edema, HA, vision changes, N/V/D, abdominal pain, urinary symptoms, rash, weight changes, and recent illness or hospitalizations.      History of Present Illness              Health Maintenance Due  Topic Date Due   DTaP/Tdap/Td (6 - Tdap) 01/16/1992   Hepatitis C Screening  Never done   Pneumococcal Vaccine 33-24 Years old (1 of 2 - PCV) Never done   Hepatitis B Vaccines (1 of 3 - 19+ 3-dose series) Never done   HPV VACCINES (1 - Risk 3-dose SCDM series) Never done    Past Medical History:  Diagnosis Date   Hypertension    Kidney failure    Kidney transplant recipient 2016   Seizures (HCC)    last seizure 01/2013   Thrush    Vitamin D  deficiency      Past Surgical History:  Procedure Laterality Date   KIDNEY TRANSPLANT     WRIST SURGERY Left     Family History  Problem Relation Age of Onset   Diabetes Maternal Grandmother    Hyperlipidemia Maternal Grandfather     Social History   Socioeconomic History   Marital status: Married    Spouse name: Elfida Juaquin)   Number of children: 3   Years of education: Not on file   Highest education level: 12th grade  Occupational History   Not on file  Tobacco Use   Smoking status: Every Day    Current packs/day: 1.00    Average packs/day: 1 pack/day for 24.5 years (24.5 ttl pk-yrs)    Types: Cigarettes    Start date: 2001   Smokeless tobacco: Never  Substance and Sexual Activity   Alcohol use: Not Currently   Drug use: Yes    Types: Marijuana    Comment: occassionally   Sexual activity: Not on file  Other Topics Concern   Not on file  Social History Narrative   3 children- 2 boys 1 girl   Social Drivers of Corporate investment banker Strain: Not on file  Food Insecurity: Low Risk  (11/02/2023)   Received from [facility]   Hunger Vital Sign    Within the past 12 months, you worried that your food would run out before  you got money to buy more: Never true    Within the past 12 months, the food you bought just didn't last and you didn't have money to get more. : Never true  Transportation Needs: No Transportation Needs (11/02/2023)   Received from [facility]    In the past 12 months, has lack of reliable transportation kept you from medical appointments, meetings, work or from getting things needed for daily living? : No  Physical Activity: Not on file  Stress: Not on file  Social Connections: Unknown (04/16/2023)   Received from [facility]   Social Network    Social Network: Not on file  Intimate Partner Violence: Not At Risk (07/03/2023)   Humiliation, Afraid, Rape, and Kick questionnaire    Fear of Current or Ex-Partner: No     Emotionally Abused: No    Physically Abused: No    Sexually Abused: No    Outpatient Medications Prior to Visit  Medication Sig Dispense Refill   albuterol  (PROVENTIL ) (2.5 MG/3ML) 0.083% nebulizer solution Take 3 mLs (2.5 mg total) by nebulization every 4 (four) hours as needed for wheezing or shortness of breath (sputum). 75 mL 0   amLODipine  (NORVASC ) 10 MG tablet Take 10 mg by mouth daily.       cholecalciferol  (VITAMIN D ) 1000 UNITS tablet Take 1,000 Units by mouth daily.     labetalol  (NORMODYNE ) 300 MG tablet Take 1,200 mg by mouth 2 (two) times daily.     losartan  (COZAAR ) 100 MG tablet Take 100 mg by mouth daily.     magnesium  oxide (MAG-OX) 400 MG tablet Take 400 mg by mouth 2 (two) times daily.     mycophenolate  (CELLCEPT ) 250 MG capsule Take 1,000 mg by mouth 2 (two) times daily.       predniSONE  (DELTASONE ) 5 MG tablet Take 1 tablet (5 mg total) by mouth daily.     tacrolimus  (PROGRAF ) 0.5 MG capsule Take 0.5 mg by mouth 2 (two) times daily.     dextromethorphan -guaiFENesin  (MUCINEX  DM) 30-600 MG 12hr tablet Take 1 tablet by mouth 2 (two) times daily.     doxycycline  (VIBRA -TABS) 100 MG tablet Take 1 tablet (100 mg total) by mouth every 12 (twelve) hours. 4 tablet 0   No facility-administered medications prior to visit.    Allergies  Allergen Reactions   Lisinopril Swelling    angioedema    ROS See HPI    Objective:    Physical Exam  General: No acute distress. Awake and conversant.  Eyes: Normal conjunctiva, anicteric. Round symmetric pupils.  Respiratory: CTAB. Respirations are non-labored. No wheezing.  Skin: Warm. No rashes or ulcers.  Psych: Alert and oriented. Cooperative, Appropriate mood and affect, Normal judgment.  CV: RRR. No murmur. No lower extremity edema.  MSK: Normal ambulation. No clubbing or cyanosis.  Neuro:  CN II-XII grossly normal.    BP 126/80 (BP Location: Right Arm, Patient Position: Sitting, Cuff Size: Large)   Pulse 85   Temp 98.3  F (36.8 C) (Oral)   Resp 12   Ht 5' 7 (1.702 m)   Wt 223 lb 9.6 oz (101.4 kg)   SpO2 96%   BMI 35.02 kg/m  Wt Readings from Last 3 Encounters:  02/24/24 223 lb 9.6 oz (101.4 kg)  07/02/23 170 lb (77.1 kg)  09/14/16 170 lb (77.1 kg)       Assessment & Plan:   Problem List Items Addressed This Visit     Encounter to establish  care with new provider - Primary   History of sleep study   Referral to pulmonology for sleep study.      Relevant Orders   Ambulatory referral to Pulmonology   History of tobacco abuse   Counseled patient on the health risks of smoking. Encouraged use of support resources like Quitline (1-800-QUIT-NOW), apps, or counseling for additional accountability and motivation.Will monitor progress and provide continued support.      Hypertension   Well controlled, no changes to meds. Encouraged heart healthy diet such as the DASH diet and exercise as tolerated.        Hx Renal transplant long-term use of immunosupresents- Followed by nephrology ([facility])- Dr. Benjamen [facility].   FU 1 month CPE  Portions of this note were dictated using DRAGON voice recognition software. Please disregard any errors in transcription.    Patient was educated on the diagnosis, treatment options, potential risks, benefits, and alternatives. All questions were addressed. Patient verbalized understanding and agrees with the plan of care. Will follow up as advised or sooner if symptoms worsen or new concerns arise.   I have discontinued Rodricus Candelaria. Beveridge's dextromethorphan -guaiFENesin  and doxycycline . I am also having him maintain his amLODipine , mycophenolate , magnesium  oxide, cholecalciferol , losartan , tacrolimus , labetalol , predniSONE , and albuterol .  No orders of the defined types were placed in this encounter.

## 2024-02-24 ENCOUNTER — Ambulatory Visit: Admitting: Student

## 2024-02-24 ENCOUNTER — Encounter: Payer: Self-pay | Admitting: Student

## 2024-02-24 VITALS — BP 126/80 | HR 85 | Temp 98.3°F | Resp 12 | Ht 67.0 in | Wt 223.6 lb

## 2024-02-24 DIAGNOSIS — Z87891 Personal history of nicotine dependence: Secondary | ICD-10-CM

## 2024-02-24 DIAGNOSIS — Z9289 Personal history of other medical treatment: Secondary | ICD-10-CM | POA: Diagnosis not present

## 2024-02-24 DIAGNOSIS — I10 Essential (primary) hypertension: Secondary | ICD-10-CM | POA: Diagnosis not present

## 2024-02-24 DIAGNOSIS — Z7689 Persons encountering health services in other specified circumstances: Secondary | ICD-10-CM

## 2024-02-24 NOTE — Assessment & Plan Note (Signed)
 Well controlled, no changes to meds. Encouraged heart healthy diet such as the DASH diet and exercise as tolerated.

## 2024-02-24 NOTE — Patient Instructions (Signed)

## 2024-02-24 NOTE — Assessment & Plan Note (Signed)
 Counseled patient on the health risks of smoking. Encouraged use of support resources like Quitline (1-800-QUIT-NOW), apps, or counseling for additional accountability and motivation.Will monitor progress and provide continued support.

## 2024-02-24 NOTE — Assessment & Plan Note (Signed)
 Referral to pulmonology for sleep study.

## 2024-03-24 ENCOUNTER — Encounter: Payer: Self-pay | Admitting: Student

## 2024-03-24 DIAGNOSIS — I10 Essential (primary) hypertension: Secondary | ICD-10-CM | POA: Insufficient documentation

## 2024-03-24 DIAGNOSIS — N183 Chronic kidney disease, stage 3 unspecified: Secondary | ICD-10-CM | POA: Insufficient documentation

## 2024-03-24 DIAGNOSIS — Z940 Kidney transplant status: Secondary | ICD-10-CM | POA: Insufficient documentation

## 2024-03-24 DIAGNOSIS — D849 Immunodeficiency, unspecified: Secondary | ICD-10-CM | POA: Insufficient documentation

## 2024-03-24 DIAGNOSIS — Z0000 Encounter for general adult medical examination without abnormal findings: Secondary | ICD-10-CM | POA: Insufficient documentation

## 2024-03-24 DIAGNOSIS — E785 Hyperlipidemia, unspecified: Secondary | ICD-10-CM | POA: Insufficient documentation

## 2024-03-24 DIAGNOSIS — N189 Chronic kidney disease, unspecified: Secondary | ICD-10-CM | POA: Insufficient documentation

## 2024-03-24 DIAGNOSIS — R7989 Other specified abnormal findings of blood chemistry: Secondary | ICD-10-CM | POA: Insufficient documentation

## 2024-03-24 NOTE — Assessment & Plan Note (Deleted)
 Medications dosed per nephrology.  Encouraged heart healthy diet such as the DASH diet and exercise as tolerated.

## 2024-03-24 NOTE — Progress Notes (Deleted)
 Subjective:     Patient ID: Marcus Greene, male    DOB: 1981/03/05, 43 y.o.   MRN: [id number]  No chief complaint on file.   HPI   Marcus Greene a 43 y.o.  male/male***presents today for a complete physical exam. Pt reports consuming a {diet types:17450} diet. {types:19826} Pt generally feels {DESC; WELL/FAIRLY WELL/POORLY:18703}. Reports sleeping {DESC; WELL/FAIRLY WELL/POORLY:18703}.  {does/does not:200015} have additional problems to discuss today.  Patient current smoker, denies EtOH.  New Surgeries or Family Hx: ***Yes/ Denies  Hx kidney transplant, CKD stage III-follows with Dr. Benjamen, nephrology [facility]  HTN Neurology has been dosing his blood pressure medications Labetalol  1200 mg daily (600mg  BID), lisinopril 20 mg daily, amlodipine  10 mg daily, losartan  100 mg daily  HLD No current medications  Vitamin D  deficiency Vitamin D3 1000 units daily  Hypomagnesia-followed by nephrology Magnesium  oxide 400 mg twice daily  Immunosuppression-followed by neurology Tacrolimus  (Prograf ) 0.5 mg by mouth twice daily  Asthma Albuterol  inhaler as needed   HCM: - Immunizations: PNA, DTaP, HPV due  Patient denies fever, chills, SOB, CP, palpitations, dyspnea, edema, HA, vision changes, N/V/D, abdominal pain, urinary symptoms, rash, weight changes, and recent illness or hospitalizations.     History of Present Illness              Health Maintenance Due  Topic Date Due   DTaP/Tdap/Td (6 - Tdap) 01/16/1992   Hepatitis C Screening  Never done   Pneumococcal Vaccine: 19-49 Years (1 of 2 - PCV) Never done   Hepatitis B Vaccines (1 of 3 - 19+ 3-dose series) Never done   HPV VACCINES (1 - Risk 3-dose SCDM series) Never done   INFLUENZA VACCINE  03/18/2024    Past Medical History:  Diagnosis Date   Chronic renal failure, stage 3 (moderate), unspecified whether stage 3a or 3b CKD (HCC)    Complex partial epilepsy  (HCC)    last siezure 2014   High serum parathyroid hormone (PTH)    Per Nephrology 2025-Secondary hyperparathyroidism-Secondary hyperparathyroidism-   HLD (hyperlipidemia)    HTN (hypertension)    Hx of bacterial pneumonia    Hypertension    Kidney failure    Kidney transplant recipient 2016   OSA (obstructive sleep apnea)    Seizures (HCC)    last seizure 01/2013   Thrush    Vitamin D  deficiency     Past Surgical History:  Procedure Laterality Date   KIDNEY TRANSPLANT     WRIST SURGERY Left     Family History  Problem Relation Age of Onset   Diabetes Maternal Grandmother    Hyperlipidemia Maternal Grandfather     Social History   Socioeconomic History   Marital status: Married    Spouse name: Marcus Greene)   Number of children: 3   Years of education: Not on file   Highest education level: 12th grade  Occupational History   Not on file  Tobacco Use   Smoking status: Every Day    Current packs/day: 1.00    Average packs/day: 1 pack/day for 24.6 years (24.6 ttl pk-yrs)    Types: Cigarettes    Start date: 2001   Smokeless tobacco: Never  Substance and Sexual Activity   Alcohol use: Not Currently   Drug use: Yes    Types: Marijuana    Comment: occassionally   Sexual activity: Not on file  Other Topics Concern   Not on file  Social History Narrative   3  children- 2 boys 1 girl   Social Drivers of Corporate investment banker Strain: Not on file  Food Insecurity: Low Risk  (11/02/2023)   Received from [facility]   Hunger Vital Sign    Within the past 12 months, you worried that your food would run out before you got money to buy more: Never true    Within the past 12 months, the food you bought just didn't last and you didn't have money to get more. : Never true  Transportation Needs: No Transportation Needs (11/02/2023)   Received from [facility]    In the past 12 months, has lack of reliable transportation kept you from medical  appointments, meetings, work or from getting things needed for daily living? : No  Physical Activity: Not on file  Stress: Not on file  Social Connections: Unknown (04/16/2023)   Received from [facility]   Social Network    Social Network: Not on file  Intimate Partner Violence: Not At Risk (07/03/2023)   Humiliation, Afraid, Rape, and Kick questionnaire    Fear of Current or Ex-Partner: No    Emotionally Abused: No    Physically Abused: No    Sexually Abused: No    Outpatient Medications Prior to Visit  Medication Sig Dispense Refill   albuterol  (PROVENTIL ) (2.5 MG/3ML) 0.083% nebulizer solution Take 3 mLs (2.5 mg total) by nebulization every 4 (four) hours as needed for wheezing or shortness of breath (sputum). 75 mL 0   amLODipine  (NORVASC ) 10 MG tablet Take 10 mg by mouth daily.       cholecalciferol  (VITAMIN D ) 1000 UNITS tablet Take 1,000 Units by mouth daily.     labetalol  (NORMODYNE ) 300 MG tablet Take 1,200 mg by mouth 2 (two) times daily.     losartan  (COZAAR ) 100 MG tablet Take 100 mg by mouth daily.     magnesium  oxide (MAG-OX) 400 MG tablet Take 400 mg by mouth 2 (two) times daily.     mycophenolate  (CELLCEPT ) 250 MG capsule Take 1,000 mg by mouth 2 (two) times daily.       predniSONE  (DELTASONE ) 5 MG tablet Take 1 tablet (5 mg total) by mouth daily.     tacrolimus  (PROGRAF ) 0.5 MG capsule Take 0.5 mg by mouth 2 (two) times daily.     No facility-administered medications prior to visit.    Allergies  Allergen Reactions   Lisinopril Swelling    angioedema    ROS See HPI    Objective:    Physical Exam Constitutional:      General: He is not in acute distress.    Appearance: He is not ill-appearing, toxic-appearing or diaphoretic.  HENT:     Head: Normocephalic and atraumatic.     Right Ear: Tympanic membrane, ear canal and external ear normal.     Left Ear: Tympanic membrane, ear canal and external ear normal.     Nose: Nose normal. No congestion.      Mouth/Throat:     Mouth: Mucous membranes are moist.     Pharynx: Oropharynx is clear.  Eyes:     Extraocular Movements: Extraocular movements intact.     Right eye: Normal extraocular motion.     Left eye: Normal extraocular motion.     Conjunctiva/sclera: Conjunctivae normal.     Pupils: Pupils are equal, round, and reactive to light.  Neck:     Thyroid: No thyroid mass or thyromegaly.     Vascular: No carotid bruit  or JVD.  Cardiovascular:     Rate and Rhythm: Normal rate and regular rhythm.     Pulses: Normal pulses.          Radial pulses are 2+ on the right side and 2+ on the left side.       Dorsalis pedis pulses are 2+ on the right side and 2+ on the left side.     Heart sounds: Normal heart sounds, S1 normal and S2 normal. No murmur heard.    No friction rub. No gallop.  Pulmonary:     Effort: Pulmonary effort is normal. No respiratory distress.     Breath sounds: Normal breath sounds.  Abdominal:     General: Bowel sounds are normal. There is no distension.     Palpations: Abdomen is soft.     Tenderness: There is no abdominal tenderness. There is no guarding.  Musculoskeletal:        General: Normal range of motion.     Cervical back: Full passive range of motion without pain and normal range of motion. No edema or erythema.     Right lower leg: No edema.     Left lower leg: No edema.  Lymphadenopathy:     Cervical: No cervical adenopathy.  Skin:    General: Skin is warm and dry.     Capillary Refill: Capillary refill takes less than 2 seconds.  Neurological:     General: No focal deficit present.     Mental Status: He is alert and oriented to person, place, and time.     Cranial Nerves: No cranial nerve deficit.     Motor: No weakness.     Coordination: Coordination normal.     Gait: Gait normal.     Deep Tendon Reflexes: Reflexes normal.  Psychiatric:        Mood and Affect: Mood normal.        Behavior: Behavior normal.        Thought Content: Thought  content normal.      There were no vitals taken for this visit. Wt Readings from Last 3 Encounters:  02/24/24 223 lb 9.6 oz (101.4 kg)  07/02/23 170 lb (77.1 kg)  09/14/16 170 lb (77.1 kg)       Assessment & Plan:   Problem List Items Addressed This Visit     Annual physical exam - Primary   Patient encouraged to maintain heart healthy diet, regular exercise, adequate sleep. Consider daily probiotics.         Chronic renal failure, stage 3 (moderate), unspecified whether stage 3a or 3b CKD (HCC)   Followed by nephrology-[facility] Dr. Benjamen.  Continue follow up with nephrology.        High serum parathyroid hormone (PTH)   Elevated PTH due to secondary hyperparathyroidism,stable as of March 2025- per nephrology. Repeat PTH, continue to monitor.      History of tobacco abuse   Social History Tobacco Cessation: Ready to Quit: No; Counseling Given: Yes          HLD (hyperlipidemia)   Update lipid panel.      HTN (hypertension)   Medications dosed per nephrology.  Encouraged heart healthy diet such as the DASH diet and exercise as tolerated.        Hx of kidney transplant   Post transplant -patient CKD stage III per nephrology, continue to follow-up with kidney specialist.      Preventative health care   Patient encouraged to maintain heart healthy  diet, regular exercise, adequate sleep. Consider daily probiotics. Take medications as prescribed Labs ordered and reviewed.    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      Other Visit Diagnoses       Family history of diabetes mellitus          Hx of frontal lobe epilepsy Patient was previously seen by neurology. Previously on carbamazepine and levetiracetam., not currently taking these medications. Has not been seen by neurology since approximately 2020.  He is stable and has had no recent seizure episodes. Continue to  monitor.  OSA Patient has history of OSA, ***?  Follow-up 3 months  Portions of this note were dictated using DRAGON voice recognition software. Please disregard any errors in transcription.    I am having Lonni PARAS. [place] maintain his amLODipine , mycophenolate , magnesium  oxide, cholecalciferol , losartan , tacrolimus , labetalol , predniSONE , and albuterol .  No orders of the defined types were placed in this encounter.

## 2024-03-24 NOTE — Assessment & Plan Note (Deleted)
 Update lipid panel.

## 2024-03-24 NOTE — Assessment & Plan Note (Deleted)
 Post transplant -patient CKD stage III per nephrology, continue to follow-up with kidney specialist.

## 2024-03-24 NOTE — Assessment & Plan Note (Deleted)
 Supplement and monitor

## 2024-03-24 NOTE — Assessment & Plan Note (Deleted)
 Patient encouraged to maintain heart healthy diet, regular exercise, adequate sleep. Consider daily probiotics. Take medications as prescribed Labs ordered and reviewed.    HCM: - Immunizations: PNA, DTaP, HPV due

## 2024-03-24 NOTE — Assessment & Plan Note (Deleted)
 Elevated PTH due to secondary hyperparathyroidism,stable as of March 2025- per nephrology. Repeat PTH, continue to monitor.

## 2024-03-24 NOTE — Assessment & Plan Note (Deleted)
Patient encouraged to maintain heart healthy diet, regular exercise, adequate sleep. Consider daily probiotics 

## 2024-03-24 NOTE — Assessment & Plan Note (Deleted)
 Followed by nephrology-[facility] Dr. Benjamen.  Continue follow up with nephrology.

## 2024-03-24 NOTE — Assessment & Plan Note (Deleted)
 Social History Tobacco Cessation: Ready to Quit: No; Counseling Given: Yes

## 2024-03-25 ENCOUNTER — Ambulatory Visit: Admitting: Student

## 2024-03-25 ENCOUNTER — Encounter: Payer: Self-pay | Admitting: *Deleted

## 2024-03-25 DIAGNOSIS — I151 Hypertension secondary to other renal disorders: Secondary | ICD-10-CM

## 2024-03-25 DIAGNOSIS — Z0000 Encounter for general adult medical examination without abnormal findings: Secondary | ICD-10-CM

## 2024-03-25 DIAGNOSIS — Z87891 Personal history of nicotine dependence: Secondary | ICD-10-CM

## 2024-03-25 DIAGNOSIS — R7989 Other specified abnormal findings of blood chemistry: Secondary | ICD-10-CM

## 2024-03-25 DIAGNOSIS — Z940 Kidney transplant status: Secondary | ICD-10-CM

## 2024-03-25 DIAGNOSIS — E785 Hyperlipidemia, unspecified: Secondary | ICD-10-CM

## 2024-03-25 DIAGNOSIS — N189 Chronic kidney disease, unspecified: Secondary | ICD-10-CM

## 2024-03-25 DIAGNOSIS — N183 Chronic kidney disease, stage 3 unspecified: Secondary | ICD-10-CM

## 2024-03-25 DIAGNOSIS — Z833 Family history of diabetes mellitus: Secondary | ICD-10-CM
